# Patient Record
Sex: Female | Born: 1955 | Race: Black or African American | Hispanic: No | Smoking: Current every day smoker
Health system: Southern US, Community
[De-identification: ages and names within clinical notes are randomized; demographics above are authoritative.]

## PROBLEM LIST (undated history)

## (undated) DIAGNOSIS — F1910 Other psychoactive substance abuse, uncomplicated: Secondary | ICD-10-CM

## (undated) DIAGNOSIS — B1920 Unspecified viral hepatitis C without hepatic coma: Secondary | ICD-10-CM

## (undated) DIAGNOSIS — F32A Depression, unspecified: Secondary | ICD-10-CM

## (undated) DIAGNOSIS — I10 Essential (primary) hypertension: Secondary | ICD-10-CM

## (undated) DIAGNOSIS — M1990 Unspecified osteoarthritis, unspecified site: Secondary | ICD-10-CM

## (undated) DIAGNOSIS — F329 Major depressive disorder, single episode, unspecified: Secondary | ICD-10-CM

## (undated) DIAGNOSIS — J449 Chronic obstructive pulmonary disease, unspecified: Secondary | ICD-10-CM

## (undated) HISTORY — DX: Other psychoactive substance abuse, uncomplicated: F19.10

## (undated) HISTORY — DX: Major depressive disorder, single episode, unspecified: F32.9

## (undated) HISTORY — PX: ABDOMINAL HYSTERECTOMY: SHX81

## (undated) HISTORY — DX: Unspecified osteoarthritis, unspecified site: M19.90

## (undated) HISTORY — DX: Depression, unspecified: F32.A

## (undated) HISTORY — DX: Unspecified viral hepatitis C without hepatic coma: B19.20

## (undated) HISTORY — DX: Chronic obstructive pulmonary disease, unspecified: J44.9

## (undated) HISTORY — PX: FRACTURE SURGERY: SHX138

---

## 2000-10-16 ENCOUNTER — Emergency Department (HOSPITAL_COMMUNITY): Admission: EM | Admit: 2000-10-16 | Discharge: 2000-10-16 | Payer: Self-pay | Admitting: *Deleted

## 2000-12-12 ENCOUNTER — Emergency Department (HOSPITAL_COMMUNITY): Admission: EM | Admit: 2000-12-12 | Discharge: 2000-12-12 | Payer: Self-pay | Admitting: Emergency Medicine

## 2000-12-12 ENCOUNTER — Encounter: Payer: Self-pay | Admitting: Emergency Medicine

## 2001-07-07 ENCOUNTER — Emergency Department (HOSPITAL_COMMUNITY): Admission: EM | Admit: 2001-07-07 | Discharge: 2001-07-08 | Payer: Self-pay | Admitting: Emergency Medicine

## 2001-07-15 ENCOUNTER — Emergency Department (HOSPITAL_COMMUNITY): Admission: EM | Admit: 2001-07-15 | Discharge: 2001-07-15 | Payer: Self-pay | Admitting: Emergency Medicine

## 2002-10-12 ENCOUNTER — Emergency Department (HOSPITAL_COMMUNITY): Admission: EM | Admit: 2002-10-12 | Discharge: 2002-10-12 | Payer: Self-pay | Admitting: *Deleted

## 2003-02-08 ENCOUNTER — Encounter: Payer: Self-pay | Admitting: Emergency Medicine

## 2003-02-08 ENCOUNTER — Emergency Department (HOSPITAL_COMMUNITY): Admission: EM | Admit: 2003-02-08 | Discharge: 2003-02-08 | Payer: Self-pay | Admitting: Emergency Medicine

## 2003-08-13 ENCOUNTER — Ambulatory Visit (HOSPITAL_COMMUNITY): Admission: RE | Admit: 2003-08-13 | Discharge: 2003-08-13 | Payer: Self-pay | Admitting: Internal Medicine

## 2004-03-24 ENCOUNTER — Ambulatory Visit (HOSPITAL_COMMUNITY): Admission: RE | Admit: 2004-03-24 | Discharge: 2004-03-24 | Payer: Self-pay | Admitting: Family Medicine

## 2004-03-27 ENCOUNTER — Ambulatory Visit: Payer: Self-pay | Admitting: Family Medicine

## 2004-04-01 ENCOUNTER — Ambulatory Visit: Payer: Self-pay | Admitting: Family Medicine

## 2004-05-08 ENCOUNTER — Ambulatory Visit: Payer: Self-pay | Admitting: Family Medicine

## 2004-07-14 ENCOUNTER — Ambulatory Visit: Payer: Self-pay | Admitting: Family Medicine

## 2004-08-14 ENCOUNTER — Emergency Department (HOSPITAL_COMMUNITY): Admission: EM | Admit: 2004-08-14 | Discharge: 2004-08-14 | Payer: Self-pay | Admitting: Emergency Medicine

## 2004-11-24 ENCOUNTER — Ambulatory Visit: Payer: Self-pay | Admitting: Family Medicine

## 2005-01-18 ENCOUNTER — Ambulatory Visit: Payer: Self-pay | Admitting: Family Medicine

## 2005-01-25 ENCOUNTER — Ambulatory Visit (HOSPITAL_COMMUNITY): Admission: RE | Admit: 2005-01-25 | Discharge: 2005-01-25 | Payer: Self-pay | Admitting: Family Medicine

## 2005-02-24 ENCOUNTER — Ambulatory Visit (HOSPITAL_COMMUNITY): Admission: RE | Admit: 2005-02-24 | Discharge: 2005-02-24 | Payer: Self-pay | Admitting: *Deleted

## 2005-03-23 ENCOUNTER — Ambulatory Visit: Payer: Self-pay | Admitting: Family Medicine

## 2005-04-19 ENCOUNTER — Ambulatory Visit: Payer: Self-pay | Admitting: Family Medicine

## 2005-06-29 ENCOUNTER — Ambulatory Visit: Payer: Self-pay | Admitting: Family Medicine

## 2005-08-10 ENCOUNTER — Ambulatory Visit: Payer: Self-pay | Admitting: Family Medicine

## 2005-08-19 ENCOUNTER — Encounter (HOSPITAL_COMMUNITY): Admission: RE | Admit: 2005-08-19 | Discharge: 2005-09-18 | Payer: Self-pay | Admitting: Family Medicine

## 2005-11-11 ENCOUNTER — Ambulatory Visit: Payer: Self-pay | Admitting: Family Medicine

## 2006-07-12 ENCOUNTER — Ambulatory Visit: Payer: Self-pay | Admitting: Family Medicine

## 2006-07-14 ENCOUNTER — Ambulatory Visit (HOSPITAL_COMMUNITY): Admission: RE | Admit: 2006-07-14 | Discharge: 2006-07-14 | Payer: Self-pay | Admitting: Family Medicine

## 2006-07-14 ENCOUNTER — Encounter: Payer: Self-pay | Admitting: Family Medicine

## 2006-07-14 LAB — CONVERTED CEMR LAB
ALT: 51 units/L — ABNORMAL HIGH (ref 0–35)
AST: 77 units/L — ABNORMAL HIGH (ref 0–37)
Albumin: 4 g/dL (ref 3.5–5.2)
Alkaline Phosphatase: 88 units/L (ref 39–117)
Basophils Relative: 0 % (ref 0–1)
CO2: 23 meq/L (ref 19–32)
Chloride: 104 meq/L (ref 96–112)
Cholesterol: 163 mg/dL (ref 0–200)
Creatinine, Ser: 0.86 mg/dL (ref 0.40–1.20)
Glucose, Bld: 81 mg/dL (ref 70–99)
HCT: 42.9 % (ref 36.0–46.0)
Hemoglobin: 14.2 g/dL (ref 12.0–15.0)
LDL Cholesterol: 65 mg/dL (ref 0–99)
Lymphocytes Relative: 51 % — ABNORMAL HIGH (ref 12–46)
Lymphs Abs: 2.4 10*3/uL (ref 0.7–3.3)
MCHC: 33.1 g/dL (ref 30.0–36.0)
MCV: 97.1 fL (ref 78.0–100.0)
Monocytes Relative: 17 % — ABNORMAL HIGH (ref 3–11)
Neutro Abs: 1.4 10*3/uL — ABNORMAL LOW (ref 1.7–7.7)
Platelets: 262 10*3/uL (ref 150–400)
Potassium: 4.1 meq/L (ref 3.5–5.3)
RBC: 4.42 M/uL (ref 3.87–5.11)
Sodium: 138 meq/L (ref 135–145)
Total Bilirubin: 0.6 mg/dL (ref 0.3–1.2)
Triglycerides: 113 mg/dL (ref <150)
WBC: 4.7 10*3/uL (ref 4.0–10.5)

## 2006-08-22 ENCOUNTER — Ambulatory Visit: Payer: Self-pay | Admitting: Family Medicine

## 2006-08-22 LAB — CONVERTED CEMR LAB: Trichomonal Vaginitis: POSITIVE — AB

## 2006-08-23 ENCOUNTER — Encounter: Payer: Self-pay | Admitting: Family Medicine

## 2006-08-23 LAB — CONVERTED CEMR LAB
Chlamydia, DNA Probe: NEGATIVE
GC Probe Amp, Genital: NEGATIVE

## 2007-03-10 ENCOUNTER — Ambulatory Visit: Payer: Self-pay | Admitting: Family Medicine

## 2007-04-13 ENCOUNTER — Encounter: Payer: Self-pay | Admitting: Family Medicine

## 2007-09-20 ENCOUNTER — Ambulatory Visit: Payer: Self-pay | Admitting: Family Medicine

## 2007-09-26 DIAGNOSIS — S139XXA Sprain of joints and ligaments of unspecified parts of neck, initial encounter: Secondary | ICD-10-CM

## 2007-09-26 DIAGNOSIS — F17200 Nicotine dependence, unspecified, uncomplicated: Secondary | ICD-10-CM

## 2007-09-26 DIAGNOSIS — F329 Major depressive disorder, single episode, unspecified: Secondary | ICD-10-CM

## 2007-09-26 DIAGNOSIS — M542 Cervicalgia: Secondary | ICD-10-CM | POA: Insufficient documentation

## 2007-09-26 DIAGNOSIS — N760 Acute vaginitis: Secondary | ICD-10-CM | POA: Insufficient documentation

## 2007-10-02 ENCOUNTER — Ambulatory Visit: Payer: Self-pay | Admitting: Family Medicine

## 2007-10-12 ENCOUNTER — Other Ambulatory Visit: Admission: RE | Admit: 2007-10-12 | Discharge: 2007-10-12 | Payer: Self-pay | Admitting: Family Medicine

## 2007-10-12 ENCOUNTER — Ambulatory Visit: Payer: Self-pay | Admitting: Family Medicine

## 2007-10-12 ENCOUNTER — Encounter: Payer: Self-pay | Admitting: Family Medicine

## 2007-10-13 ENCOUNTER — Encounter: Payer: Self-pay | Admitting: Family Medicine

## 2007-10-13 LAB — CONVERTED CEMR LAB
ALT: 23 units/L (ref 0–35)
Alkaline Phosphatase: 76 units/L (ref 39–117)
BUN: 12 mg/dL (ref 6–23)
Basophils Absolute: 0 10*3/uL (ref 0.0–0.1)
Basophils Relative: 1 % (ref 0–1)
Bilirubin, Direct: 0.2 mg/dL (ref 0.0–0.3)
Calcium: 9.4 mg/dL (ref 8.4–10.5)
Chloride: 107 meq/L (ref 96–112)
Creatinine, Ser: 0.7 mg/dL (ref 0.40–1.20)
Eosinophils Absolute: 0.2 10*3/uL (ref 0.0–0.7)
Eosinophils Relative: 4 % (ref 0–5)
Hemoglobin: 13.8 g/dL (ref 12.0–15.0)
Lymphocytes Relative: 43 % (ref 12–46)
MCHC: 33.3 g/dL (ref 30.0–36.0)
Potassium: 4.4 meq/L (ref 3.5–5.3)
Sodium: 141 meq/L (ref 135–145)
Total Bilirubin: 0.6 mg/dL (ref 0.3–1.2)
Total Protein: 7.9 g/dL (ref 6.0–8.3)
Trichomonal Vaginitis: POSITIVE — AB
Triglycerides: 51 mg/dL (ref <150)
WBC: 5.6 10*3/uL (ref 4.0–10.5)

## 2008-01-03 ENCOUNTER — Telehealth: Payer: Self-pay | Admitting: Family Medicine

## 2008-02-13 ENCOUNTER — Ambulatory Visit: Payer: Self-pay | Admitting: Family Medicine

## 2008-02-13 DIAGNOSIS — K029 Dental caries, unspecified: Secondary | ICD-10-CM | POA: Insufficient documentation

## 2008-02-18 DIAGNOSIS — F1020 Alcohol dependence, uncomplicated: Secondary | ICD-10-CM | POA: Insufficient documentation

## 2008-02-18 DIAGNOSIS — F1420 Cocaine dependence, uncomplicated: Secondary | ICD-10-CM | POA: Insufficient documentation

## 2008-04-12 ENCOUNTER — Telehealth: Payer: Self-pay | Admitting: Family Medicine

## 2008-04-22 ENCOUNTER — Encounter: Payer: Self-pay | Admitting: Family Medicine

## 2008-05-01 ENCOUNTER — Encounter: Payer: Self-pay | Admitting: Family Medicine

## 2008-05-14 ENCOUNTER — Encounter: Payer: Self-pay | Admitting: Family Medicine

## 2008-05-22 ENCOUNTER — Ambulatory Visit: Payer: Self-pay | Admitting: Family Medicine

## 2008-05-22 DIAGNOSIS — J209 Acute bronchitis, unspecified: Secondary | ICD-10-CM

## 2008-10-23 ENCOUNTER — Ambulatory Visit: Payer: Self-pay | Admitting: Family Medicine

## 2008-10-23 DIAGNOSIS — R079 Chest pain, unspecified: Secondary | ICD-10-CM | POA: Insufficient documentation

## 2008-10-23 DIAGNOSIS — R5381 Other malaise: Secondary | ICD-10-CM

## 2008-10-23 DIAGNOSIS — R5383 Other fatigue: Secondary | ICD-10-CM

## 2008-10-24 ENCOUNTER — Encounter: Payer: Self-pay | Admitting: Family Medicine

## 2008-10-24 LAB — CONVERTED CEMR LAB
BUN: 10 mg/dL (ref 6–23)
Basophils Absolute: 0 10*3/uL (ref 0.0–0.1)
Basophils Relative: 0 % (ref 0–1)
Calcium: 9.2 mg/dL (ref 8.4–10.5)
Chloride: 110 meq/L (ref 96–112)
Eosinophils Relative: 3 % (ref 0–5)
Glucose, Bld: 92 mg/dL (ref 70–99)
HCT: 38.2 % (ref 36.0–46.0)
Hemoglobin: 13 g/dL (ref 12.0–15.0)
MCHC: 34 g/dL (ref 30.0–36.0)
MCV: 96.2 fL (ref 78.0–100.0)
Monocytes Relative: 11 % (ref 3–12)
Neutro Abs: 1.4 10*3/uL — ABNORMAL LOW (ref 1.7–7.7)
Neutrophils Relative %: 28 % — ABNORMAL LOW (ref 43–77)
Total CHOL/HDL Ratio: 2.7
Triglycerides: 299 mg/dL — ABNORMAL HIGH (ref <150)
WBC: 5.1 10*3/uL (ref 4.0–10.5)

## 2008-11-06 ENCOUNTER — Telehealth: Payer: Self-pay | Admitting: Family Medicine

## 2008-11-11 ENCOUNTER — Encounter: Payer: Self-pay | Admitting: Family Medicine

## 2008-11-28 ENCOUNTER — Telehealth: Payer: Self-pay | Admitting: Family Medicine

## 2008-12-03 ENCOUNTER — Encounter: Payer: Self-pay | Admitting: Family Medicine

## 2008-12-11 ENCOUNTER — Encounter: Payer: Self-pay | Admitting: Family Medicine

## 2009-08-21 ENCOUNTER — Ambulatory Visit: Payer: Self-pay | Admitting: Family Medicine

## 2009-08-21 DIAGNOSIS — J019 Acute sinusitis, unspecified: Secondary | ICD-10-CM

## 2010-01-13 ENCOUNTER — Telehealth: Payer: Self-pay | Admitting: Family Medicine

## 2010-01-14 ENCOUNTER — Ambulatory Visit: Payer: Self-pay | Admitting: Family Medicine

## 2010-01-14 DIAGNOSIS — J45909 Unspecified asthma, uncomplicated: Secondary | ICD-10-CM

## 2010-01-14 LAB — CONVERTED CEMR LAB
Basophils Absolute: 0 10*3/uL (ref 0.0–0.1)
Basophils Relative: 0 % (ref 0–1)
CO2: 27 meq/L (ref 19–32)
Calcium: 9.6 mg/dL (ref 8.4–10.5)
Chloride: 105 meq/L (ref 96–112)
Cholesterol: 189 mg/dL (ref 0–200)
Creatinine, Ser: 0.78 mg/dL (ref 0.40–1.20)
Eosinophils Relative: 3 % (ref 0–5)
HDL: 67 mg/dL (ref >39)
Hemoglobin: 14.2 g/dL (ref 12.0–15.0)
MCV: 96.1 fL (ref 78.0–100.0)
Monocytes Absolute: 0.5 10*3/uL (ref 0.1–1.0)
Monocytes Relative: 9 % (ref 3–12)
Neutrophils Relative %: 36 % — ABNORMAL LOW (ref 43–77)
Platelets: 240 10*3/uL (ref 150–400)
Sodium: 140 meq/L (ref 135–145)
TSH: 1.328 microintl units/mL (ref 0.350–4.500)
Total CHOL/HDL Ratio: 2.8
Triglycerides: 69 mg/dL (ref <150)
VLDL: 14 mg/dL (ref 0–40)

## 2010-01-18 DIAGNOSIS — G4700 Insomnia, unspecified: Secondary | ICD-10-CM

## 2010-02-18 ENCOUNTER — Encounter: Payer: Self-pay | Admitting: Family Medicine

## 2010-02-19 ENCOUNTER — Telehealth: Payer: Self-pay | Admitting: Family Medicine

## 2010-02-20 ENCOUNTER — Ambulatory Visit: Payer: Self-pay | Admitting: Family Medicine

## 2010-02-20 DIAGNOSIS — B1710 Acute hepatitis C without hepatic coma: Secondary | ICD-10-CM | POA: Insufficient documentation

## 2010-02-24 ENCOUNTER — Encounter: Payer: Self-pay | Admitting: Family Medicine

## 2010-02-24 ENCOUNTER — Ambulatory Visit (HOSPITAL_COMMUNITY): Admission: RE | Admit: 2010-02-24 | Discharge: 2010-02-24 | Payer: Self-pay | Admitting: Family Medicine

## 2010-03-04 ENCOUNTER — Encounter: Payer: Self-pay | Admitting: Family Medicine

## 2010-03-05 ENCOUNTER — Encounter: Payer: Self-pay | Admitting: Family Medicine

## 2010-03-06 ENCOUNTER — Ambulatory Visit (HOSPITAL_COMMUNITY): Admission: RE | Admit: 2010-03-06 | Payer: Self-pay | Admitting: Family Medicine

## 2010-03-10 ENCOUNTER — Telehealth: Payer: Self-pay | Admitting: Family Medicine

## 2010-03-24 ENCOUNTER — Encounter: Payer: Self-pay | Admitting: Family Medicine

## 2010-03-24 ENCOUNTER — Telehealth (INDEPENDENT_AMBULATORY_CARE_PROVIDER_SITE_OTHER): Payer: Self-pay | Admitting: *Deleted

## 2010-04-16 ENCOUNTER — Ambulatory Visit: Payer: Self-pay | Admitting: Family Medicine

## 2010-04-17 ENCOUNTER — Encounter: Payer: Self-pay | Admitting: Family Medicine

## 2010-05-01 ENCOUNTER — Encounter: Payer: Self-pay | Admitting: Family Medicine

## 2010-05-05 ENCOUNTER — Encounter: Payer: Self-pay | Admitting: Family Medicine

## 2010-05-05 ENCOUNTER — Telehealth: Payer: Self-pay | Admitting: Family Medicine

## 2010-05-12 ENCOUNTER — Telehealth: Payer: Self-pay | Admitting: Family Medicine

## 2010-05-13 ENCOUNTER — Encounter: Payer: Self-pay | Admitting: Family Medicine

## 2010-05-14 ENCOUNTER — Encounter: Payer: Self-pay | Admitting: Family Medicine

## 2010-05-21 LAB — CONVERTED CEMR LAB
Alkaline Phosphatase: 89 units/L (ref 39–117)
Bilirubin, Direct: 0.1 mg/dL (ref 0.0–0.3)

## 2010-05-23 ENCOUNTER — Encounter: Payer: Self-pay | Admitting: Family Medicine

## 2010-05-24 ENCOUNTER — Encounter: Payer: Self-pay | Admitting: Family Medicine

## 2010-05-25 ENCOUNTER — Encounter: Payer: Self-pay | Admitting: Family Medicine

## 2010-05-27 ENCOUNTER — Encounter: Payer: Self-pay | Admitting: Family Medicine

## 2010-06-02 NOTE — Progress Notes (Signed)
Summary: went to dentist  Phone Note Call from Patient   Summary of Call: went to dentist and the dentisit said she has hep c  and to call you and let her know   dr. Lynann Beaver in [place] call her at [phone] only speak to her Initial call taken by: [name],  February 19, 2010 2:42 PM  Follow-up for Phone Call        pls contact the dentist's officre for the positive results , fax a stamped request from this office, she will be referred to Dr Renae Fickle , the [facility] doc downstairs once i have the results for further evaluation and treatment, let her kn ow  Follow-up by: Syliva Overman MD,  February 19, 2010 7:57 PM  Additional Follow-up for Phone Call Additional follow up Details #1::        Dr. Randa Evens office is faxing over most recent labs. Additional Follow-up by: Mauricia Area CMA,  February 23, 2010 2:23 PM    Additional Follow-up for Phone Call Additional follow up Details #2::    info received Follow-up by: Syliva Overman MD,  February 24, 2010 5:55 PM

## 2010-06-02 NOTE — Assessment & Plan Note (Signed)
Summary: ov   Vital Signs:  Patient profile:   55 year old female Menstrual status:  postmenopausal Height:      64 inches Weight:      147.50 pounds BMI:     25.41 O2 Sat:      98 % Pulse rate:   72 / minute Pulse rhythm:   regular Resp:     16 per minute  Vitals Entered By: Everitt Amber LPN (January 14, 2010 10:40 AM)  Nutrition Counseling: Patient's BMI is greater than 25 and therefore counseled on weight management options. CC: Follow up chronic problems, supposed to have teeth pulled monday and she has a bad toothache and wants pain meds to last until then. Also having some chest congestion     Menstrual Status postmenopausal Last PAP Result normal   Primary Care Leanah Kolander:  Syliva Overman MD  CC:  Follow up chronic problems and supposed to have teeth pulled monday and she has a bad toothache and wants pain meds to last until then. Also having some chest congestion.  History of Present Illness: Reports  that sahe has been doing fairly well. She is bothered by a bad toothache as well as chest congestion in the past 2 weeks she denies less alcohol use.  Denies recent fever or chills. Denies sinus pressure, nasal congestion , ear pain or sore throat.  Denies chest pain, palpitations, PND, orthopnea or leg swelling. Denies abdominal pain, nausea, vomitting, diarrhea or constipation. Denies change in bowel movements or bloody stool. Denies dysuria , frequency, incontinence or hesitancy. Denies  joint pain, swelling, or reduced mobility. Denies headaches, vertigo, seizures. Denies depression ort anxietyshe does have  insomnia. Denies  rash, lesions, or itch.     Current Medications (verified): 1)  Flexeril 10 Mg Tabs (Cyclobenzaprine Hcl) .... Take 1 Tab By Mouth At Bedtime As Needed 2)  Tramadol Hcl 50 Mg Tabs (Tramadol Hcl) .... Take 1 Tablet By Mouth Once A Day As Needed 3)  Proventil Hfa 108 (90 Base) Mcg/act Aers (Albuterol Sulfate) .... 2 Puffs Every 6 To 8  Hrs As Needed 4)  Ra Omeprazole 20 Mg Tbec (Omeprazole) .... Take 1 Capsule By Mouth Once A Day 5)  Fluoxetine Hcl 10 Mg Caps (Fluoxetine Hcl) .... Take 1 Capsule By Mouth Once A Day 6)  Naprosyn 500 Mg Tabs (Naproxen) .... Take 1 Two Times A Day   Discontinue Ibuprofen  Allergies (verified): 1)  ! Pcn  Review of Systems      See HPI General:  Complains of fatigue and sleep disorder; reports difficulty falling and staying asleep, wants help. Eyes:  Denies discharge, eye pain, and red eye. Heme:  Denies abnormal bruising and bleeding. Allergy:  Denies hives or rash and itching eyes.  Physical Exam  General:  Well-developed,adequately nourished,in no acute distress; alert,appropriate and cooperative throughout examination HEENT: No facial asymmetry,  EOMI, No sinus tenderness, TM's Clear, oropharynx  pink and moist. Poor dentition with multiple cavities  Chest: decreased air entry scattewred crackles, no wheezes CVS: S1, S2, No murmurs, No S3.   Abd: Soft, Nontender.  MS: Adequate ROM spine, hips, shoulders and knees.  Ext: No edema.   CNS: CN 2-12 intact, power tone and sensation normal throughout.   Skin: Intact, no visible lesions or rashes.  Psych: Good eye contact, normal affect.  Memory intact, not anxious or depressed appearing.    Impression & Recommendations:  Problem # 1:  INTRINSIC ASTHMA, UNSPECIFIED (ICD-493.10) Assessment Unchanged  Her updated medication  list for this problem includes:    Proventil Hfa 108 (90 Base) Mcg/act Aers (Albuterol sulfate) .Marland Kitchen... 2 puffs every 6 to 8 hrs as needed  Problem # 2:  DENTAL CARIES (ICD-521.00) Assessment: Deteriorated  Problem # 3:  DEPRESSION (ICD-311) Assessment: Improved  Her updated medication list for this problem includes:    Fluoxetine Hcl 10 Mg Caps (Fluoxetine hcl) .Marland Kitchen... Take 1 capsule by mouth once a day  Problem # 4:  INSOMNIA (ICD-780.52) Assessment: Deteriorated  Her updated medication list for this  problem includes:    Temazepam 15 Mg Caps (Temazepam) .Marland Kitchen... Take 1 capsule by mouth at bedtime  Discussed sleep hygiene.   Complete Medication List: 1)  Flexeril 10 Mg Tabs (Cyclobenzaprine hcl) .... Take 1 tab by mouth at bedtime as needed 2)  Tramadol Hcl 50 Mg Tabs (Tramadol hcl) .... Take 1 tablet by mouth once a day as needed 3)  Proventil Hfa 108 (90 Base) Mcg/act Aers (Albuterol sulfate) .... 2 puffs every 6 to 8 hrs as needed 4)  Ra Omeprazole 20 Mg Tbec (Omeprazole) .... Take 1 capsule by mouth once a day 5)  Fluoxetine Hcl 10 Mg Caps (Fluoxetine hcl) .... Take 1 capsule by mouth once a day 6)  Naprosyn 500 Mg Tabs (Naproxen) .... Take 1 two times a day   discontinue ibuprofen 7)  Temazepam 15 Mg Caps (Temazepam) .... Take 1 capsule by mouth at bedtime 8)  Erythromycin Base 250 Mg Tabs (Erythromycin base) .... Take 1 tablet by mouth three times a day  Other Orders: T-Basic Metabolic Panel [phone]) T-Lipid Profile [phone]) T-CBC w/Diff [phone]) T-TSH [phone]) Radiology Referral (Radiology)  Patient Instructions: 1)  CPE in 2 to 3 months 2)  BMP prior to visit, ICD-9: 3)  Lipid Panel prior to visit, ICD-9:  fastting today 4)  TSH prior to visit, ICD-9: 5)  CBC w/ Diff prior to visit, ICD-9: 6)  We will refer you for a mamogram 7)  New med for sleep and med for dental caries  Prescriptions: NAPROSYN 500 MG TABS (NAPROXEN) take 1 two times a day   Discontinue Ibuprofen  #60 x 2   Entered by:   Everitt Amber LPN   Authorized by:   Syliva Overman MD   Signed by:   Everitt Amber LPN on 24/40/1027   Method used:   Electronically to        [facility] [address] [phone]* (retail)       [address] [place], [place]  [address]       Ph: [phone]       Fax: [phone]   RxID:   [id number] TRAMADOL HCL 50 MG TABS (TRAMADOL HCL) Take 1 tablet by mouth once a day as needed  #30 x 2   Entered by:   Everitt Amber LPN   Authorized by:   Syliva Overman MD   Signed by:   Everitt Amber LPN on 23/55/7322   Method used:   Electronically to        [facility] [address] [phone]* (retail)       [address] [place], [place]  [address]       Ph: [phone]       Fax: [phone]   RxID:   [id number] FLUOXETINE HCL 10 MG CAPS (FLUOXETINE HCL) Take 1 capsule by mouth once a day  #30 x 2   Entered by:   Merry Proud  Hudy LPN   Authorized by:   Syliva Overman MD   Signed by:   Everitt Amber LPN on 10/27/9483   Method used:   Electronically to        [facility] [phone]* (retail)       [address] [place], [place]  [address]       Ph: [phone]       Fax: [phone]   RxID:   [id number] RA OMEPRAZOLE 20 MG TBEC (OMEPRAZOLE) Take 1 capsule by mouth once a day  #30 x 2   Entered by:   Everitt Amber LPN   Authorized by:   Syliva Overman MD   Signed by:   Everitt Amber LPN on 35/36/1443   Method used:   Electronically to        [facility] [address] [phone]* (retail)       [address] [place], [place]  [address]       Ph: [phone]       Fax: [phone]   RxID:   [id number] PROVENTIL HFA 108 (90 BASE) MCG/ACT AERS (ALBUTEROL SULFATE) 2 puffs every 6 to 8 hrs as needed  #1 x 2   Entered by:   Everitt Amber LPN   Authorized by:   Syliva Overman MD   Signed by:   Everitt Amber LPN on 02/40/9735   Method used:   Electronically to        [facility] [address] [phone]* (retail)       [address] [place], [place]  [address]       Ph: [phone]       Fax: [phone]   RxID:   [id number] FLEXERIL 10 MG TABS (CYCLOBENZAPRINE HCL) Take 1 tab by mouth at bedtime as needed  #30 x 2   Entered by:   Everitt Amber LPN   Authorized by:   Syliva Overman MD   Signed by:   Everitt Amber LPN on 26/37/8588   Method used:   Electronically to        [facility] [phone]* (retail)       [address] [place], [place]  [address]       Ph: [phone]       Fax: [phone]   RxID:    [id number] ERYTHROMYCIN BASE 250 MG TABS (ERYTHROMYCIN BASE) Take 1 tablet by mouth three times a day  #30 x 90   Entered and Authorized by:   Syliva Overman MD   Signed by:   Syliva Overman MD on 01/14/2010   Method used:   Electronically to        [facility] [address] [phone]* (retail)       [address] [place], [place]  [address]       Ph: [phone]       Fax: [phone]   RxID:   [id number] TEMAZEPAM 15 MG CAPS (TEMAZEPAM) Take 1 capsule by mouth at bedtime  #30 x 2   Entered and Authorized by:   Syliva Overman MD   Signed by:   Syliva Overman MD on 01/14/2010   Method used:   Printed then faxed to ...       [facility] [address] [phone]* (retail)       [address]  [place], [place]  [address]       Ph: [phone]       Fax: [phone]   RxID:   [id number]

## 2010-06-02 NOTE — Letter (Signed)
Summary: DR. Karilyn Cota PAT NOT RETURNING CALL  DR. Karilyn Cota PAT NOT RETURNING CALL   Imported By: [name] 03/24/2010 17:28:59  _____________________________________________________________________  External Attachment:    Type:   Image     Comment:   External Document

## 2010-06-02 NOTE — Progress Notes (Signed)
Summary: lab order  Phone Note Call from Patient   Summary of Call: needs for you to send over her lab order you will be there next week like monday at 10:00 am Initial call taken by: [name],  March 10, 2010 8:34 AM  Follow-up for Phone Call        sent as requested Follow-up by: [name] LPN,  March 10, 2010 9:54 AM

## 2010-06-02 NOTE — Assessment & Plan Note (Signed)
Summary: follow up, needs refills- room 1   Vital Signs:  Patient profile:   55 year old female Height:      64 inches Weight:      142.75 pounds BMI:     24.59 O2 Sat:      100 % on Room air Pulse rate:   84 / minute Resp:     16 per minute BP sitting:   130 / 70  (left arm)  Vitals Entered By: [name] LPN (August 21, 2009 2:16 PM) CC: follow-up visit Is Patient Diabetic? No Pain Assessment Patient in pain? no      Comments patient states she hasnt taken any meds in 2 months, didnt bring any meds   Primary Provider:  Syliva Overman MD  CC:  follow-up visit.  History of Present Illness: Pt is here today stating she needs something for her HA's, a nerve pill, inhaler & something for pain.  She reports frontal HA x 3 weeks. + nasal congestion.  Little sneezing.  Green nasal mucus.  Little cough & sometimes has wheeze. Smoker - 1pack/wk Has quit ETOH 4 mos. Denies drugs.  "I need something to help me sleep.  I wake up every 15-20 minutes."  States she has received prescription for this before & worked well.  Also gets muscle spasms in her Lt arm, neck & back.  Pt stats she is having problems with anxiety.  Has been out of meds for a few mos now. She has moved away from her previous friends who she feels were a bad influence on her.  This has helped her to quit drinking.   Allergies (verified): 1)  ! Pcn  Past History:  Past medical history reviewed for relevance to current acute and chronic problems.  Past Medical History: Reviewed history from 04/13/2007 and no changes required. NECK SPASM (ICD-847.0) VAGINITIS (ICD-616.10) NECK PAIN, CHRONIC (ICD-723.1) DEPRESSION (ICD-311) TOBACCO USER (ICD-305.1)  Review of Systems General:  Denies chills and fever. ENT:  Complains of nasal congestion and postnasal drainage; denies earache and sore throat. CV:  Denies chest pain or discomfort and palpitations. Resp:  Complains of cough and wheezing; denies  shortness of breath and sputum productive.  Physical Exam  General:  Well-developed,well-nourished,in no acute distress; alert,appropriate and cooperative throughout examination Head:  Normocephalic and atraumatic without obvious abnormalities. No apparent alopecia or balding. Ears:  External ear exam shows no significant lesions or deformities.  Otoscopic examination reveals clear canals, tympanic membranes are intact bilaterally without bulging, retraction, inflammation or discharge. Hearing is grossly normal bilaterally. Nose:  no external deformity, nasal discharge, mucosal erythema, and mucosal edema.   Mouth:  Oral mucosa and oropharynx without lesions or exudates.  poor dentition and teeth missing.   Neck:  No deformities, masses, or tenderness noted. Lungs:  Normal respiratory effort, chest expands symmetrically. Lungs are clear to auscultation, no crackles or wheezes. Heart:  Normal rate and regular rhythm. S1 and S2 normal without gallop, murmur, click, rub or other extra sounds. Msk:  FROM &nontender to palp c-spine. Pulses:  R radial normal and L radial normal.   Extremities:  No clubbing, cyanosis, edema, or deformity noted with normal full range of motion of all joints.   Neurologic:  alert & oriented X3, sensation intact to light touch, and DTRs symmetrical and normal.   Skin:  Intact without suspicious lesions or rashes Cervical Nodes:  No lymphadenopathy noted Psych:  Cognition and judgment appear intact. Alert and cooperative with normal attention span  and concentration. No apparent delusions, illusions, hallucinations   Impression & Recommendations:  Problem # 1:  SINUSITIS, ACUTE (ICD-461.9) Assessment New  Her updated medication list for this problem includes:    Bactrim Ds 800-160 Mg Tabs (Sulfamethoxazole-trimethoprim) .Marland Kitchen... Take 1 two times a day for 10 days  Problem # 2:  NECK SPASM (ICD-847.0) Assessment: Unchanged  The following medications were removed from  the medication list:    Ibuprofen 800 Mg Tabs (Ibuprofen) .Marland Kitchen... Take 1 tablet by mouth three times a day Her updated medication list for this problem includes:    Flexeril 10 Mg Tabs (Cyclobenzaprine hcl) .Marland Kitchen... Take 1 tab by mouth at bedtime as needed    Tramadol Hcl 50 Mg Tabs (Tramadol hcl) .Marland Kitchen... Take 1 tablet by mouth once a day as needed    Naprosyn 500 Mg Tabs (Naproxen) .Marland Kitchen... Take 1 two times a day   discontinue ibuprofen  Problem # 3:  DEPRESSION (ICD-311) Assessment: Deteriorated Out of meds.  Her updated medication list for this problem includes:    Fluoxetine Hcl 10 Mg Caps (Fluoxetine hcl) .Marland Kitchen... Take 1 capsule by mouth once a day  Problem # 4:  TOBACCO USER (ICD-305.1) Assessment: Unchanged  Encouraged smoking cessation   Complete Medication List: 1)  Flexeril 10 Mg Tabs (Cyclobenzaprine hcl) .... Take 1 tab by mouth at bedtime as needed 2)  Tramadol Hcl 50 Mg Tabs (Tramadol hcl) .... Take 1 tablet by mouth once a day as needed 3)  Proventil Hfa 108 (90 Base) Mcg/act Aers (Albuterol sulfate) .... 2 puffs every 6 to 8 hrs as needed 4)  Ra Omeprazole 20 Mg Tbec (Omeprazole) .... Take 1 capsule by mouth once a day 5)  Fluoxetine Hcl 10 Mg Caps (Fluoxetine hcl) .... Take 1 capsule by mouth once a day 6)  Naprosyn 500 Mg Tabs (Naproxen) .... Take 1 two times a day   discontinue ibuprofen 7)  Bactrim Ds 800-160 Mg Tabs (Sulfamethoxazole-trimethoprim) .... Take 1 two times a day for 10 days  Patient Instructions: 1)  Please schedule a follow-up appointment in 2 months wit Dr Lodema Hong. 2)  I have refilled your medications for you. 3)  I have prescribed Naproxen in place of Ibuprofen. 4)  I have prescribed an antibiotic for you. 5)  Tobacco is very bad for your health and your loved ones! You Should stop smoking!. 6)  Stop Smoking Tips: Choose a Quit date. Cut down before the Quit date. decide what you will do as a substitute when you feel the urge to  smoke(gum,toothpick,exercise). Prescriptions: BACTRIM DS 800-160 MG TABS (SULFAMETHOXAZOLE-TRIMETHOPRIM) take 1 two times a day for 10 days  #20 x 0   Entered and Authorized by:   Esperanza Sheets PA   Signed by:   Esperanza Sheets PA on 08/21/2009   Method used:   Electronically to        [facility]* (retail)       [address]       [place], [place]  [address]       Ph: [phone]       Fax: [phone]   RxID:   [id number] NAPROSYN 500 MG TABS (NAPROXEN) take 1 two times a day   Discontinue Ibuprofen  #60 x 1   Entered and Authorized by:   Esperanza Sheets PA   Signed by:   Esperanza Sheets PA on 08/21/2009   Method used:   Electronically to  [facility]* (retail)       [address]       [place], [place]  [address]       Ph: [phone]       Fax: [phone]   RxID:   [id number] RA OMEPRAZOLE 20 MG TBEC (OMEPRAZOLE) Take 1 capsule by mouth once a day  #30 x 3   Entered and Authorized by:   Esperanza Sheets PA   Signed by:   Esperanza Sheets PA on 08/21/2009   Method used:   Electronically to        [facility]* (retail)       [address]       [place], [place]  [address]       Ph: [phone]       Fax: [phone]   RxID:   [id number] FLUOXETINE HCL 10 MG CAPS (FLUOXETINE HCL) Take 1 capsule by mouth once a day  #30 x 3   Entered and Authorized by:   Esperanza Sheets PA   Signed by:   Esperanza Sheets PA on 08/21/2009   Method used:   Electronically to        [facility]* (retail)       [address]       [place], [place]  [address]       Ph: [phone]       Fax: [phone]   RxID:   [id number] PROVENTIL HFA 108 (90 BASE) MCG/ACT AERS (ALBUTEROL SULFATE) 2 puffs every 6 to 8 hrs as needed  #1 x 3   Entered and Authorized by:   Esperanza Sheets PA   Signed by:   Esperanza Sheets PA on 08/21/2009   Method used:   Electronically to         [facility]* (retail)       [address]       [place], [place]  [address]       Ph: [phone]       Fax: [phone]   RxID:   [id number] TRAMADOL HCL 50 MG TABS (TRAMADOL HCL) Take 1 tablet by mouth once a day as needed  #30 x 1   Entered and Authorized by:   Esperanza Sheets PA   Signed by:   Esperanza Sheets PA on 08/21/2009   Method used:   Electronically to        [facility]* (retail)       [address]       [place], [place]  [address]       Ph: [phone]       Fax: [phone]   RxID:   [id number] FLEXERIL 10 MG TABS (CYCLOBENZAPRINE HCL) Take 1 tab by mouth at bedtime as needed  #30 x 1   Entered and Authorized by:   Esperanza Sheets PA   Signed by:   Esperanza Sheets PA on 08/21/2009   Method used:   Electronically to        [facility]* (retail)       [address] [place], [place]  [address]       Ph: [phone]  Fax: [phone]   RxID:   [id number]

## 2010-06-02 NOTE — Miscellaneous (Signed)
  Clinical Lists Changes 

## 2010-06-02 NOTE — Progress Notes (Signed)
Summary: medicines  Phone Note Call from Patient   Summary of Call: needs all medicine to [facility]  call back at   Initial call taken by: [name],  January 13, 2010 1:02 PM  Follow-up for Phone Call        refills will be sent at time of ov tomorrow Follow-up by: [name] LPN,  January 13, 2010 1:58 PM

## 2010-06-02 NOTE — Miscellaneous (Signed)
Summary: Orders Update  Clinical Lists Changes  Orders: Added new Test order of T-Hepatic Function ([id number]) - Signed 

## 2010-06-02 NOTE — Letter (Signed)
Summary: dentisit  dentisit   Imported By: [name] 04/02/2010 08:22:21  _____________________________________________________________________  External Attachment:    Type:   Image     Comment:   External Document

## 2010-06-02 NOTE — Letter (Signed)
Summary: [facility]ME PAPER  CCME PAPER   Imported By: [name] 03/05/2010 09:23:37  _____________________________________________________________________  External Attachment:    Type:   Image     Comment:   External Document

## 2010-06-02 NOTE — Miscellaneous (Signed)
  Clinical Lists Changes  Orders: Added new Referral order of Gastroenterology Referral (GI) - Signed  
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
## 2010-06-04 NOTE — Letter (Signed)
Summary: 1st missed letter  1st missed letter   Imported By: [name] 04/17/2010 14:07:41  _____________________________________________________________________  External Attachment:    Type:   Image     Comment:   External Document

## 2010-06-04 NOTE — Letter (Signed)
Summary: CERTIFIED MAIL RECEIPT  CERTIFIED MAIL RECEIPT   Imported By: [name] 05/14/2010 16:50:59  _____________________________________________________________________  External Attachment:    Type:   Image     Comment:   External Document

## 2010-06-04 NOTE — Letter (Signed)
Summary: sent out certified  sent out certified   Imported By: Curtis Sites 05/14/2010 09:27:26  _____________________________________________________________________  External Attachment:    Type:   Image     Comment:   External Document

## 2010-06-04 NOTE — Progress Notes (Signed)
Summary: ccme paper  Phone Note Call from Patient   Summary of Call: Yvonne Clay called and she was with [name] and they want you to fill out the ccme paper and to  put  [facility] on the paper and fax back  you can call her at [phone] if needed Initial call taken by: [name],  May 05, 2010 11:56 AM  Follow-up for Phone Call        form completed Follow-up by: [name] LPN,  May 07, 2010 10:10 AM

## 2010-06-04 NOTE — Letter (Signed)
Summary: requesting patient to call  requesting patient to call   Imported By: Curtis Sites 05/05/2010 15:05:04  _____________________________________________________________________  External Attachment:    Type:   Image     Comment:   External Document

## 2010-06-04 NOTE — Letter (Signed)
Summary: returned mail  returned mail   Imported By: [name] 05/27/2010 14:03:31  _____________________________________________________________________  External Attachment:    Type:   Image     Comment:   External Document

## 2010-06-04 NOTE — Letter (Signed)
Summary: Letter  Letter   Imported By: [name] 05/13/2010 13:03:38  _____________________________________________________________________  External Attachment:    Type:   Image     Comment:   External Document

## 2010-06-04 NOTE — Progress Notes (Signed)
  Phone Note Other Incoming   Caller: dr simpson Summary of Call: this pt does not qualify on medical grounds for personal care services, she is able to care for herself, pls let her know Initial call taken by: Syliva Overman MD,  May 12, 2010 8:00 AM  Follow-up for Phone Call        called patient, no answer Follow-up by: [name] LPN,  May 12, 2010 2:33 PM  Additional Follow-up for Phone Call Additional follow up Details #1::        called patient, left message, mailed letter Additional Follow-up by: [name] LPN,  May 13, 2010 9:40 AM

## 2010-06-04 NOTE — Progress Notes (Signed)
Summary: Gastro referral  Phone Note Outgoing Call   Summary of Call: I called Mrs Rumsey to inform her that we were trying to schedule her an appt. with dr Patty Sermons office.  I also called to follow up to see if she had lab work done.  The home # on file a lady answered and said No Dandra Langland lived there, called b/f cell and asked him to have her call us, he called back and gave me another # [phone].  I have left a msg for her to return my call.  I faxed over paper work to Dr Patty Sermons office an dthey said they have tried calling patient 3 times to advise patient they needed lab work from Education officer, community.  and she hasn't called them back or brought labs in.  Until they have the results they can't schedule her an appt.  Luann had already sent a request to them, which I wasn't aware of, so when I sent the paperwork down that is when they responded and told me all of the above. Initial call taken by: Curtis Sites,  March 24, 2010 9:52 AM  Follow-up for Phone Call        thanks, noted, if you can ge her on the tele, psl asdk herto come by so we can explain the imptance of keeping appt with GI Follow-up by: Syliva Overman MD,  March 24, 2010 12:13 PM  Additional Follow-up for Phone Call Additional follow up Details #1::        Tried calling patient no answer. Curtis Sites  March 31, 2010 4:52 PM     Additional Follow-up for Phone Call Additional follow up Details #2::    I mailed her out a letter on Friday to see if she would please contact us so we can make appt. Follow-up by: Curtis Sites,  May 05, 2010 3:31 PM

## 2010-06-04 NOTE — Letter (Signed)
Summary: returned letter from mail  returned letter from mail   Imported By: [name] 05/05/2010 16:34:58  _____________________________________________________________________  External Attachment:    Type:   Image     Comment:   External Document

## 2010-07-16 ENCOUNTER — Encounter: Payer: Self-pay | Admitting: Family Medicine

## 2010-08-11 ENCOUNTER — Encounter: Payer: Self-pay | Admitting: Family Medicine

## 2010-08-12 ENCOUNTER — Encounter: Payer: Self-pay | Admitting: Family Medicine

## 2010-08-13 ENCOUNTER — Encounter: Payer: Self-pay | Admitting: Family Medicine

## 2010-08-13 ENCOUNTER — Ambulatory Visit (INDEPENDENT_AMBULATORY_CARE_PROVIDER_SITE_OTHER): Payer: Medicaid Other | Admitting: Family Medicine

## 2010-08-13 VITALS — BP 112/70 | HR 107 | Resp 16 | Ht 65.25 in | Wt 140.1 lb

## 2010-08-13 DIAGNOSIS — M25571 Pain in right ankle and joints of right foot: Secondary | ICD-10-CM

## 2010-08-13 DIAGNOSIS — M62838 Other muscle spasm: Secondary | ICD-10-CM

## 2010-08-13 DIAGNOSIS — J45909 Unspecified asthma, uncomplicated: Secondary | ICD-10-CM

## 2010-08-13 DIAGNOSIS — F17200 Nicotine dependence, unspecified, uncomplicated: Secondary | ICD-10-CM

## 2010-08-13 DIAGNOSIS — M25579 Pain in unspecified ankle and joints of unspecified foot: Secondary | ICD-10-CM

## 2010-08-13 DIAGNOSIS — B1710 Acute hepatitis C without hepatic coma: Secondary | ICD-10-CM

## 2010-08-13 DIAGNOSIS — J449 Chronic obstructive pulmonary disease, unspecified: Secondary | ICD-10-CM

## 2010-08-13 MED ORDER — CYCLOBENZAPRINE HCL 10 MG PO TABS: 10.0000 mg | ORAL_TABLET | Freq: Every evening | ORAL | Status: DC | PRN

## 2010-08-13 MED ORDER — CYCLOBENZAPRINE HCL 10 MG PO TABS: 10.0000 mg | ORAL_TABLET | Freq: Three times a day (TID) | ORAL | Status: DC | PRN

## 2010-08-13 NOTE — Patient Instructions (Signed)
Physical and pap in 4 months.  You are being referred to orthopedics about your ankle   New med sent in for muscle spasm which you have in your neck

## 2010-08-13 NOTE — Progress Notes (Signed)
  Subjective:    Patient ID: Yvonne Clay, female    DOB: 07-12-1955, 55 y.o.   MRN: [id number]  HPI  C/o increased right ankle pain with instability , she hurt it approx 10 yrs ago. Never saw ortho , states her ins was declined by Dr. Hilda Lias. Reports when she tries to walk ,the ankle twists and it as though she will fall.  States she is unable to  obtain teeth, and that food is sticking when she tries to swallow it. No recent asthma flares, and reports less nicotine and alcohol use.  Review of Systems Denies recent fever or chills. Denies sinus pressure, nasal congestion, ear pain or sore throat. Denies chest congestion, productive cough or wheezing. Denies chest pains, palpitations, paroxysmal nocturnal dyspnea, orthopnea and leg swelling Denies abdominal pain, nausea, vomiting,diarrhea or constipation.  Denies rectal bleeding or change in bowel movement. . Denies depression, anxiety or insomnia. Denies skin break down or rash.        Objective:   Physical Exam Patient alert and oriented and in no Cardiopulmonary distress.  HEENT: No facial asymmetry, EOMI, , Oropharynx pink and moist.  Neck supple no adenopathy.  Chest: Clear to auscultation bilaterally.decreased air entry bilaterally  CVS: S1, S2 no murmurs, no S3.  ABD: Soft non tender. Bowel sounds normal.  Ext: No edema  MS: Adequate ROM spine, shoulders, hips and knees.Decreased in right ankle , which is swollen and tender  Skin: Intact, no ulcerations or rash noted.    CNS: CN 2-12 intact, power, tone and sensation normal throughout.        Assessment & Plan:

## 2010-08-19 ENCOUNTER — Encounter: Payer: Self-pay | Admitting: Family Medicine

## 2010-08-24 ENCOUNTER — Telehealth: Payer: Self-pay | Admitting: Family Medicine

## 2010-08-26 NOTE — Telephone Encounter (Signed)
pls let them know in my opinion pt is able to bathe and feed herself and is fully ambulatory, does not need personal care

## 2010-08-28 NOTE — Telephone Encounter (Signed)
Advised patient does not qualify

## 2010-09-07 ENCOUNTER — Encounter: Payer: Self-pay | Admitting: Family Medicine

## 2010-09-07 DIAGNOSIS — J449 Chronic obstructive pulmonary disease, unspecified: Secondary | ICD-10-CM | POA: Insufficient documentation

## 2010-09-07 DIAGNOSIS — M25571 Pain in right ankle and joints of right foot: Secondary | ICD-10-CM | POA: Insufficient documentation

## 2010-09-07 NOTE — Assessment & Plan Note (Signed)
Improved due to reduce nicotine use

## 2010-09-07 NOTE — Assessment & Plan Note (Signed)
Pt still has not been to GI for evaluation despite referral and attempts by the clinic to contact her, dx made in 2011 when she had dental extraction

## 2010-09-07 NOTE — Assessment & Plan Note (Addendum)
Reports decreased usage, therefore improved

## 2010-09-07 NOTE — Assessment & Plan Note (Signed)
stable °

## 2010-09-07 NOTE — Assessment & Plan Note (Signed)
Deteriorated, refer to ortho

## 2010-12-03 ENCOUNTER — Encounter: Payer: Self-pay | Admitting: Family Medicine

## 2010-12-04 ENCOUNTER — Encounter: Payer: Self-pay | Admitting: Family Medicine

## 2010-12-07 ENCOUNTER — Encounter: Payer: Self-pay | Admitting: Family Medicine

## 2010-12-07 ENCOUNTER — Ambulatory Visit (INDEPENDENT_AMBULATORY_CARE_PROVIDER_SITE_OTHER): Payer: Medicaid Other | Admitting: Family Medicine

## 2010-12-07 VITALS — BP 120/70 | HR 82 | Resp 16 | Ht 64.5 in | Wt 130.4 lb

## 2010-12-07 DIAGNOSIS — J45909 Unspecified asthma, uncomplicated: Secondary | ICD-10-CM

## 2010-12-07 DIAGNOSIS — G4700 Insomnia, unspecified: Secondary | ICD-10-CM

## 2010-12-07 DIAGNOSIS — S139XXA Sprain of joints and ligaments of unspecified parts of neck, initial encounter: Secondary | ICD-10-CM

## 2010-12-07 DIAGNOSIS — Z23 Encounter for immunization: Secondary | ICD-10-CM

## 2010-12-07 DIAGNOSIS — F329 Major depressive disorder, single episode, unspecified: Secondary | ICD-10-CM

## 2010-12-07 DIAGNOSIS — Z9181 History of falling: Secondary | ICD-10-CM

## 2010-12-07 DIAGNOSIS — B1920 Unspecified viral hepatitis C without hepatic coma: Secondary | ICD-10-CM

## 2010-12-07 DIAGNOSIS — Z1211 Encounter for screening for malignant neoplasm of colon: Secondary | ICD-10-CM

## 2010-12-07 DIAGNOSIS — R319 Hematuria, unspecified: Secondary | ICD-10-CM

## 2010-12-07 DIAGNOSIS — F3289 Other specified depressive episodes: Secondary | ICD-10-CM

## 2010-12-07 DIAGNOSIS — R109 Unspecified abdominal pain: Secondary | ICD-10-CM

## 2010-12-07 DIAGNOSIS — R5381 Other malaise: Secondary | ICD-10-CM

## 2010-12-07 DIAGNOSIS — B1710 Acute hepatitis C without hepatic coma: Secondary | ICD-10-CM

## 2010-12-07 DIAGNOSIS — J40 Bronchitis, not specified as acute or chronic: Secondary | ICD-10-CM

## 2010-12-07 DIAGNOSIS — Z79899 Other long term (current) drug therapy: Secondary | ICD-10-CM

## 2010-12-07 LAB — POCT URINALYSIS DIPSTICK
Glucose, UA: NEGATIVE
Nitrite, UA: NEGATIVE
Urobilinogen, UA: 2

## 2010-12-07 MED ORDER — SULFAMETHOXAZOLE-TRIMETHOPRIM 800-160 MG PO TABS: 1.0000 | ORAL_TABLET | Freq: Two times a day (BID) | ORAL | Status: AC

## 2010-12-07 MED ORDER — BENZONATATE 100 MG PO CAPS: 100.0000 mg | ORAL_CAPSULE | Freq: Four times a day (QID) | ORAL | Status: DC | PRN

## 2010-12-07 NOTE — Assessment & Plan Note (Signed)
Antibiotics prescribed 

## 2010-12-07 NOTE — Assessment & Plan Note (Signed)
Sleep hygiene discussed, controlled on medication

## 2010-12-07 NOTE — Assessment & Plan Note (Signed)
Unchanged no med change, stable at this time

## 2010-12-07 NOTE — Assessment & Plan Note (Signed)
New lower abdominal pain, and needs colonoscopy, advised pt of need to see GI for colonoscopy as well as for hep C, several unsuccesful attempts were made from the GI office in the past to contact her, she has provided a # for her sister

## 2010-12-07 NOTE — Patient Instructions (Addendum)
CPE in November   Medication is sent in for your cough  Fasting labs before next visit.  LABWORK  NEEDS TO BE DONE BETWEEN 3 TO 7 DAYS BEFORE YOUR NEXT SCEDULED  VISIT.  THIS WILL IMPROVE THE QUALITY OF YOUR CARE.    I will sign off on a request for help at home  You need  To stop smoking and using alcohol and street drugs  You will be referred to a GI specialist for a colonscopy

## 2010-12-07 NOTE — Progress Notes (Signed)
  Subjective:    Patient ID: Yvonne Clay, female    DOB: 07/15/1955, 55 y.o.   MRN: [id number]  HPI 1 week h/o fever , chills increased chest congestion and cough. Sputum is yellow. Using her inhaler every other day. Reports increased bilateral lower extremity pain, states they are weak, she has repeated near falls at home, and needs someone to help with her bath and other chores in the house. Also reports numbness and spasm in her left upper ext, states she does not feel safe cooking and also needs help with this. Based on history, not objective findings assistance at home will be granted. Pt also c/o intermittent lower abdominal cramping pain, denies overt UTI symptoms, denies any change in bowel movements.    Review of Systems Denies recent fever or chills. Denies sinus pressure, nasal congestion, ear pain or sore throat. Denies chest pains, palpitations and leg swelling Denies  nausea, vomiting,diarrhea or constipation.   Denies dysuria, frequency, hesitancy or incontinence. Denies joint pain, swelling and limitation in mobility. Denies headaches, seizures, numbness, or tingling. Denies uncontrolled  depression, anxiety or insomnia. Denies skin break down or rash.        Objective:   Physical Exam Patient alert and oriented and in no cardiopulmonary distress.  HEENT: No facial asymmetry, EOMI, no sinus tenderness,  oropharynx pink and moist.  Neck supple no adenopathy.  Chest: decreased air entry, bilateral crackles , few wheezes CVS: S1, S2 no murmurs, no S3.  ABD: Soft non tender. Bowel sounds normal.  Ext: No edema  MS: Adequate ROM spine, shoulders, hips and knees.  Skin: Intact, no ulcerations or rash noted.  Psych: Good eye contact, normal affect. Memory intact not anxious or depressed appearing.  CNS: CN 2-12 intact,  tone and sensation normal throughout.Unsteady gait with mild lower extremity weakness. Let upper extremity decreased sensation  reported        Assessment & Plan:

## 2010-12-07 NOTE — Assessment & Plan Note (Signed)
Reports increased spasm with inability to safely cook, will assist help at home

## 2010-12-07 NOTE — Assessment & Plan Note (Signed)
Controlled, no change in medication  

## 2010-12-09 LAB — URINE CULTURE

## 2010-12-14 ENCOUNTER — Encounter: Payer: Medicaid Other | Admitting: Family Medicine

## 2010-12-16 ENCOUNTER — Encounter: Payer: Self-pay | Admitting: Family Medicine

## 2011-01-01 ENCOUNTER — Telehealth: Payer: Self-pay | Admitting: Family Medicine

## 2011-01-01 ENCOUNTER — Other Ambulatory Visit: Payer: Self-pay

## 2011-01-01 DIAGNOSIS — J40 Bronchitis, not specified as acute or chronic: Secondary | ICD-10-CM

## 2011-01-01 MED ORDER — BENZONATATE 100 MG PO CAPS: 100.0000 mg | ORAL_CAPSULE | Freq: Four times a day (QID) | ORAL | Status: DC | PRN

## 2011-01-01 NOTE — Telephone Encounter (Signed)
Resent it in

## 2011-02-25 ENCOUNTER — Encounter (HOSPITAL_COMMUNITY): Payer: Self-pay | Admitting: Emergency Medicine

## 2011-02-25 ENCOUNTER — Emergency Department (HOSPITAL_COMMUNITY)
Admission: EM | Admit: 2011-02-25 | Discharge: 2011-02-25 | Disposition: A | Discharge status: Home / Self Care | Payer: Medicaid Other | Attending: Emergency Medicine | Admitting: Emergency Medicine

## 2011-02-25 ENCOUNTER — Emergency Department (HOSPITAL_COMMUNITY): Payer: Medicaid Other

## 2011-02-25 DIAGNOSIS — R300 Dysuria: Secondary | ICD-10-CM | POA: Insufficient documentation

## 2011-02-25 DIAGNOSIS — N390 Urinary tract infection, site not specified: Secondary | ICD-10-CM | POA: Insufficient documentation

## 2011-02-25 DIAGNOSIS — R059 Cough, unspecified: Secondary | ICD-10-CM | POA: Insufficient documentation

## 2011-02-25 DIAGNOSIS — R509 Fever, unspecified: Secondary | ICD-10-CM | POA: Insufficient documentation

## 2011-02-25 DIAGNOSIS — R3989 Other symptoms and signs involving the genitourinary system: Secondary | ICD-10-CM | POA: Insufficient documentation

## 2011-02-25 DIAGNOSIS — R319 Hematuria, unspecified: Secondary | ICD-10-CM | POA: Insufficient documentation

## 2011-02-25 DIAGNOSIS — R0602 Shortness of breath: Secondary | ICD-10-CM | POA: Insufficient documentation

## 2011-02-25 DIAGNOSIS — R05 Cough: Secondary | ICD-10-CM | POA: Insufficient documentation

## 2011-02-25 DIAGNOSIS — F17200 Nicotine dependence, unspecified, uncomplicated: Secondary | ICD-10-CM | POA: Insufficient documentation

## 2011-02-25 DIAGNOSIS — J189 Pneumonia, unspecified organism: Secondary | ICD-10-CM | POA: Insufficient documentation

## 2011-02-25 DIAGNOSIS — R350 Frequency of micturition: Secondary | ICD-10-CM | POA: Insufficient documentation

## 2011-02-25 LAB — URINALYSIS, ROUTINE W REFLEX MICROSCOPIC
Protein, ur: 100 mg/dL — AB
Urobilinogen, UA: >8 mg/dL — ABNORMAL HIGH (ref 0.0–1.0)

## 2011-02-25 LAB — URINE MICROSCOPIC-ADD ON

## 2011-02-25 MED ORDER — AZITHROMYCIN 250 MG PO TABS: ORAL_TABLET | ORAL | Status: DC

## 2011-02-25 MED ORDER — PHENAZOPYRIDINE HCL 200 MG PO TABS: 200.0000 mg | ORAL_TABLET | Freq: Three times a day (TID) | ORAL | Status: AC | PRN

## 2011-02-25 MED ORDER — LIDOCAINE HCL (PF) 1 % IJ SOLN
2.1000 mL | Freq: Once | INTRAMUSCULAR | Status: AC
  Administered 2011-02-25: 2.1 mL
  Filled 2011-02-25: qty 5

## 2011-02-25 MED ORDER — NITROFURANTOIN MONOHYD MACRO 100 MG PO CAPS
100.0000 mg | ORAL_CAPSULE | Freq: Once | ORAL | Status: AC
  Administered 2011-02-25: 100 mg via ORAL
  Filled 2011-02-25: qty 1

## 2011-02-25 MED ORDER — CEFTRIAXONE SODIUM 1 G IJ SOLR
1.0000 g | Freq: Once | INTRAMUSCULAR | Status: AC
  Administered 2011-02-25: 1 g via INTRAMUSCULAR
  Filled 2011-02-25: qty 1

## 2011-02-25 MED ORDER — PHENAZOPYRIDINE HCL 100 MG PO TABS
200.0000 mg | ORAL_TABLET | Freq: Once | ORAL | Status: AC
  Administered 2011-02-25: 200 mg via ORAL
  Filled 2011-02-25: qty 2

## 2011-02-25 MED ORDER — AZITHROMYCIN 250 MG PO TABS
500.0000 mg | ORAL_TABLET | Freq: Once | ORAL | Status: AC
  Administered 2011-02-25: 500 mg via ORAL
  Filled 2011-02-25: qty 2

## 2011-02-25 MED ORDER — NITROFURANTOIN MONOHYD MACRO 100 MG PO CAPS: 100.0000 mg | ORAL_CAPSULE | Freq: Two times a day (BID) | ORAL | Status: AC

## 2011-02-25 NOTE — ED Provider Notes (Signed)
History     CSN: [id number] Arrival date & time: 02/25/2011  9:44 AM   First MD Initiated Contact with Patient 02/25/11 [phone]      Chief Complaint  Patient presents with  . Cough  . Shortness of Breath  . Dysuria    (Consider location/radiation/quality/duration/timing/severity/associated sxs/prior treatment) Patient is a 55 y.o. female presenting with cough, shortness of breath, and dysuria. The history is provided by the patient. No language interpreter was used.  Cough This is a new problem. The current episode started more than 1 week ago. The problem occurs every few minutes. The problem has not changed since onset.The cough is productive of sputum. The fever has been present for 1 to 2 days (subjective fever.). Associated symptoms include chills and shortness of breath. Pertinent negatives include no wheezing. She has tried nothing for the symptoms. She is a smoker.  Shortness of Breath  Associated symptoms include a fever, cough and shortness of breath. Pertinent negatives include no stridor and no wheezing.  Dysuria  Associated symptoms include chills, frequency and hematuria.    Past Medical History  Diagnosis Date  . Substance abuse   . Depression   . COPD (chronic obstructive pulmonary disease)   . Arthritis   . Hepatitis C dx in 2011 by dentist    History reviewed. No pertinent past surgical history.  History reviewed. No pertinent family history.  History  Substance Use Topics  . Smoking status: Current Everyday Smoker -- 0.3 packs/day  . Smokeless tobacco: Not on file  . Alcohol Use: 0.0 oz/week     patient states she drinks about two forty oz drinks a week    OB History    Grav Para Term Preterm Abortions TAB SAB Ect Mult Living                  Review of Systems  Constitutional: Positive for fever and chills.  Respiratory: Positive for cough and shortness of breath. Negative for choking, wheezing and stridor.   Genitourinary: Positive for dysuria,  frequency, hematuria and decreased urine volume.  All other systems reviewed and are negative.    Allergies  Penicillins  Home Medications   Current Outpatient Rx  Name Route Sig Dispense Refill  . ALBUTEROL SULFATE HFA 108 (90 BASE) MCG/ACT IN AERS Inhalation Inhale 2 puffs into the lungs. Every 6-8 hours as needed     . BENZONATATE 100 MG PO CAPS Oral Take 1 capsule (100 mg total) by mouth every 6 (six) hours as needed for cough. 30 capsule 0  . CYCLOBENZAPRINE HCL 10 MG PO TABS Oral Take 1 tablet (10 mg total) by mouth at bedtime as needed for muscle spasms. Take one tablet by mouth at bedtime as needed 30 tablet 1  . FLUOXETINE HCL 10 MG PO CAPS Oral Take 10 mg by mouth daily.      Marland Kitchen NAPROXEN 500 MG PO TABS Oral Take 500 mg by mouth. Take one tablet by mouth two times a day . Discontinue Ibuprofen     . OMEPRAZOLE 20 MG PO TBEC Oral Take by mouth daily.      Marland Kitchen TEMAZEPAM 15 MG PO CAPS Oral Take 15 mg by mouth at bedtime as needed.      Marland Kitchen TRAMADOL HCL 50 MG PO TABS Oral Take 50 mg by mouth once as needed.        BP 111/73  Pulse 93  Temp(Src) 98.6 F (37 C) (Oral)  Ht 5' 4.5" (1.638 m)  Wt 138 lb (62.596 kg)  BMI 23.32 kg/m2  SpO2 97%  Physical Exam  Nursing note and vitals reviewed. Constitutional: She is oriented to person, place, and time. She appears well-developed and well-nourished. No distress.  HENT:  Head: Normocephalic and atraumatic.  Eyes: EOM are normal.  Neck: Normal range of motion.  Cardiovascular: Normal rate, regular rhythm and normal heart sounds.   Pulmonary/Chest: Effort normal and breath sounds normal. No respiratory distress. She has no wheezes. She has no rales. She exhibits no tenderness.  Abdominal: Soft. She exhibits no distension. There is no tenderness. There is no rigidity, no guarding, no CVA tenderness, no tenderness at McBurney's point and negative Murphy's sign.    Genitourinary: No vaginal discharge found.  Musculoskeletal: Normal range  of motion.  Neurological: She is alert and oriented to person, place, and time.  Skin: Skin is warm and dry.  Psychiatric: She has a normal mood and affect. Judgment normal.    ED Course  Procedures (including critical care time)  Labs Reviewed - No data to display No results found.   No diagnosis found.    MDM         Worthy Rancher, PA 02/25/11 [phone]

## 2011-02-25 NOTE — ED Notes (Signed)
Pt sitting in chair at bedside. Pt waiting for discharge.

## 2011-02-25 NOTE — ED Notes (Signed)
Pt c/o body aches, cough, congestion, poor po intake, dysuria x one week.

## 2011-02-25 NOTE — ED Notes (Signed)
Pt c/o painful urination and states that it feels like a knife cutting her when she urinates. Also c/o productive cough with yellow sputum and aching all over. Pt alert and oriented x 3. Skin warm and dry. Color pink. Breath sounds equal bilaterally with expiratory wheezing noted. Ambulates without difficulty.

## 2011-02-26 LAB — URINE CULTURE: Colony Count: 7000

## 2011-03-02 NOTE — ED Provider Notes (Signed)
Medical screening examination/treatment/procedure(s) were performed by non-physician practitioner and as supervising physician I was immediately available for consultation/collaboration.  Donnetta Hutching, MD 03/02/11 [phone]

## 2011-03-22 ENCOUNTER — Other Ambulatory Visit: Payer: Self-pay | Admitting: Family Medicine

## 2011-03-22 ENCOUNTER — Encounter: Payer: Self-pay | Admitting: Family Medicine

## 2011-03-22 DIAGNOSIS — Z139 Encounter for screening, unspecified: Secondary | ICD-10-CM

## 2011-03-23 ENCOUNTER — Ambulatory Visit (HOSPITAL_COMMUNITY): Payer: Medicaid Other

## 2011-03-29 ENCOUNTER — Ambulatory Visit (HOSPITAL_COMMUNITY)
Admission: RE | Admit: 2011-03-29 | Discharge: 2011-03-29 | Disposition: A | Discharge status: Home / Self Care | Payer: Medicaid Other | Source: Ambulatory Visit | Attending: Family Medicine | Admitting: Family Medicine

## 2011-03-29 DIAGNOSIS — Z139 Encounter for screening, unspecified: Secondary | ICD-10-CM

## 2011-03-29 DIAGNOSIS — Z1231 Encounter for screening mammogram for malignant neoplasm of breast: Secondary | ICD-10-CM | POA: Insufficient documentation

## 2011-03-30 ENCOUNTER — Ambulatory Visit (INDEPENDENT_AMBULATORY_CARE_PROVIDER_SITE_OTHER): Payer: Medicaid Other | Admitting: Family Medicine

## 2011-03-30 ENCOUNTER — Encounter: Payer: Self-pay | Admitting: Family Medicine

## 2011-03-30 VITALS — BP 122/64 | HR 78 | Temp 98.4°F | Resp 16 | Ht 64.5 in | Wt 128.0 lb

## 2011-03-30 DIAGNOSIS — R911 Solitary pulmonary nodule: Secondary | ICD-10-CM | POA: Insufficient documentation

## 2011-03-30 DIAGNOSIS — F1020 Alcohol dependence, uncomplicated: Secondary | ICD-10-CM

## 2011-03-30 DIAGNOSIS — N390 Urinary tract infection, site not specified: Secondary | ICD-10-CM

## 2011-03-30 DIAGNOSIS — G4700 Insomnia, unspecified: Secondary | ICD-10-CM

## 2011-03-30 DIAGNOSIS — F329 Major depressive disorder, single episode, unspecified: Secondary | ICD-10-CM

## 2011-03-30 DIAGNOSIS — M62838 Other muscle spasm: Secondary | ICD-10-CM

## 2011-03-30 DIAGNOSIS — Z113 Encounter for screening for infections with a predominantly sexual mode of transmission: Secondary | ICD-10-CM

## 2011-03-30 DIAGNOSIS — F1420 Cocaine dependence, uncomplicated: Secondary | ICD-10-CM

## 2011-03-30 DIAGNOSIS — R5383 Other fatigue: Secondary | ICD-10-CM

## 2011-03-30 DIAGNOSIS — R5381 Other malaise: Secondary | ICD-10-CM

## 2011-03-30 DIAGNOSIS — M542 Cervicalgia: Secondary | ICD-10-CM

## 2011-03-30 DIAGNOSIS — J984 Other disorders of lung: Secondary | ICD-10-CM

## 2011-03-30 DIAGNOSIS — R7301 Impaired fasting glucose: Secondary | ICD-10-CM

## 2011-03-30 DIAGNOSIS — Z79899 Other long term (current) drug therapy: Secondary | ICD-10-CM

## 2011-03-30 LAB — POCT URINALYSIS DIPSTICK
Bilirubin, UA: NEGATIVE
Ketones, UA: NEGATIVE
Protein, UA: NEGATIVE
Spec Grav, UA: >=1.03
pH, UA: 5

## 2011-03-30 MED ORDER — CYCLOBENZAPRINE HCL 10 MG PO TABS: 10.0000 mg | ORAL_TABLET | Freq: Every evening | ORAL | Status: DC | PRN

## 2011-03-30 MED ORDER — FLUOXETINE HCL 10 MG PO CAPS: 10.0000 mg | ORAL_CAPSULE | Freq: Every day | ORAL | Status: DC

## 2011-03-30 MED ORDER — TEMAZEPAM 15 MG PO CAPS: 15.0000 mg | ORAL_CAPSULE | Freq: Every evening | ORAL | Status: DC | PRN

## 2011-03-30 MED ORDER — TRAMADOL HCL 50 MG PO TABS: 50.0000 mg | ORAL_TABLET | Freq: Once | ORAL | Status: DC | PRN

## 2011-03-30 MED ORDER — NAPROXEN 500 MG PO TABS: ORAL_TABLET | ORAL | Status: DC

## 2011-03-30 MED ORDER — ALBUTEROL SULFATE HFA 108 (90 BASE) MCG/ACT IN AERS: INHALATION_SPRAY | RESPIRATORY_TRACT | Status: DC

## 2011-03-30 NOTE — Assessment & Plan Note (Signed)
Sleep hygiene reviewed, needs to resume medication

## 2011-03-30 NOTE — Progress Notes (Signed)
  Subjective:    Patient ID: Yvonne Clay, female    DOB: Feb 06, 1956, 55 y.o.   MRN: [id number]  HPI Pt originally scheduled for complete exam , but is refusing. She was recently seen in the Ed for pneumonia and UTI and wants to f/u on these. States she no longer has chills, fever or significant cough. Feels well, and denies dysuria but hs mild frequency. C/o neck pain and spasm   Review of Systems    See HPI Denies recent fever or chills. Denies sinus pressure, nasal congestion, ear pain or sore throat. Denies chest congestion, productive cough or wheezing. Denies chest pains, palpitations and leg swelling Denies abdominal pain, nausea, vomiting,diarrhea or constipation.   Denies dysuria, frequency, hesitancy or incontinence.  Denies headaches, seizures, numbness, or tingling. c/o depression and  anxiety  Denies crying spells, suicidal or homicidal ideation, wants med refilled Denies skin break down or rash.     Objective:   Physical Exam Patient alert and oriented and in no cardiopulmonary distress.  HEENT: No facial asymmetry, EOMI, no sinus tenderness,  oropharynx pink and moist.  Neck  Decreased ROM with spasm no adenopathy.  Chest: Clear to auscultation bilaterally.  CVS: S1, S2 no murmurs, no S3.  ABD: Soft non tender. Bowel sounds normal.  Ext: No edema  MS: Adequate ROM spine, shoulders, hips and knees.  Skin: Intact, no ulcerations or rash noted.  Psych: Good eye contact, normal affect. Memory intact not anxious or depressed appearing.  CNS: CN 2-12 intact, power, tone and sensation normal throughout.        Assessment & Plan:

## 2011-03-30 NOTE — Assessment & Plan Note (Signed)
Daily alcohol use , counseled to quit

## 2011-03-30 NOTE — Assessment & Plan Note (Signed)
Continues to use drug, and [place] losing weight

## 2011-03-30 NOTE — Assessment & Plan Note (Signed)
Uncontrolled, not suicidal or homicidal, needs to resume med

## 2011-03-30 NOTE — Patient Instructions (Signed)
F/u in 3 months. You  Need to stop alcohol and drug use, and improve your eating habits.  Labs today and a CXR. You do need a flu vaccine

## 2011-04-04 DIAGNOSIS — N390 Urinary tract infection, site not specified: Secondary | ICD-10-CM | POA: Insufficient documentation

## 2011-04-04 NOTE — Assessment & Plan Note (Signed)
Mildly symptomatic, rept UA and c/s

## 2011-04-04 NOTE — Assessment & Plan Note (Signed)
Unchanged, muscle relaxant and pain med as before

## 2011-04-04 NOTE — Assessment & Plan Note (Signed)
Needs rept imaging study

## 2011-04-05 ENCOUNTER — Telehealth: Payer: Self-pay

## 2011-04-05 NOTE — Telephone Encounter (Signed)
Message copied by Anthoney Harada on Mon Apr 05, 2011 11:23 AM ------      Message from: Syliva Overman MD E      Created: Sun Apr 04, 2011  8:59 AM      Regarding: needs cxr       pls call pt and remind her she needs the cxr done , this was ordered since her visit last week and still not done

## 2011-04-12 ENCOUNTER — Ambulatory Visit (HOSPITAL_COMMUNITY)
Admission: RE | Admit: 2011-04-12 | Discharge: 2011-04-12 | Disposition: A | Discharge status: Home / Self Care | Payer: Medicaid Other | Source: Ambulatory Visit | Attending: Family Medicine | Admitting: Family Medicine

## 2011-04-12 DIAGNOSIS — J189 Pneumonia, unspecified organism: Secondary | ICD-10-CM | POA: Insufficient documentation

## 2011-04-12 DIAGNOSIS — R05 Cough: Secondary | ICD-10-CM | POA: Insufficient documentation

## 2011-04-12 DIAGNOSIS — R911 Solitary pulmonary nodule: Secondary | ICD-10-CM

## 2011-04-12 DIAGNOSIS — Z09 Encounter for follow-up examination after completed treatment for conditions other than malignant neoplasm: Secondary | ICD-10-CM | POA: Insufficient documentation

## 2011-04-12 DIAGNOSIS — R059 Cough, unspecified: Secondary | ICD-10-CM | POA: Insufficient documentation

## 2011-04-12 DIAGNOSIS — F17200 Nicotine dependence, unspecified, uncomplicated: Secondary | ICD-10-CM | POA: Insufficient documentation

## 2011-04-13 LAB — LIPID PANEL
Cholesterol: 141 mg/dL (ref 0–200)
HDL: 75 mg/dL (ref >39)
Total CHOL/HDL Ratio: 1.9 Ratio
Triglycerides: 74 mg/dL (ref <150)
VLDL: 15 mg/dL (ref 0–40)

## 2011-04-13 LAB — HEPATIC FUNCTION PANEL
ALT: 43 U/L — ABNORMAL HIGH (ref 0–35)
AST: 70 U/L — ABNORMAL HIGH (ref 0–37)
Albumin: 4.3 g/dL (ref 3.5–5.2)
Total Protein: 8 g/dL (ref 6.0–8.3)

## 2011-04-13 LAB — CBC WITH DIFFERENTIAL/PLATELET
Basophils Relative: 0 % (ref 0–1)
Eosinophils Absolute: 0.2 10*3/uL (ref 0.0–0.7)
Hemoglobin: 14.8 g/dL (ref 12.0–15.0)
MCHC: 32.4 g/dL (ref 30.0–36.0)
Monocytes Relative: 8 % (ref 3–12)
Neutro Abs: 1.5 10*3/uL — ABNORMAL LOW (ref 1.7–7.7)
Neutrophils Relative %: 32 % — ABNORMAL LOW (ref 43–77)
Platelets: 248 10*3/uL (ref 150–400)
RBC: 4.4 MIL/uL (ref 3.87–5.11)

## 2011-04-13 LAB — BASIC METABOLIC PANEL
Calcium: 9.7 mg/dL (ref 8.4–10.5)
Glucose, Bld: 80 mg/dL (ref 70–99)
Potassium: 4.8 mEq/L (ref 3.5–5.3)
Sodium: 141 mEq/L (ref 135–145)

## 2011-04-13 LAB — HEMOGLOBIN A1C: Hgb A1c MFr Bld: 5.1 % (ref <5.7)

## 2011-04-13 LAB — TSH: TSH: 1.583 u[IU]/mL (ref 0.350–4.500)

## 2011-04-19 NOTE — Progress Notes (Signed)
Ph disconnected. Mailed letter to call office

## 2011-05-19 ENCOUNTER — Other Ambulatory Visit: Payer: Self-pay

## 2011-05-19 DIAGNOSIS — J40 Bronchitis, not specified as acute or chronic: Secondary | ICD-10-CM

## 2011-05-19 DIAGNOSIS — J449 Chronic obstructive pulmonary disease, unspecified: Secondary | ICD-10-CM

## 2011-05-25 ENCOUNTER — Ambulatory Visit (HOSPITAL_COMMUNITY)
Admission: RE | Admit: 2011-05-25 | Discharge: 2011-05-25 | Disposition: A | Discharge status: Home / Self Care | Payer: Medicaid Other | Source: Ambulatory Visit | Attending: Family Medicine | Admitting: Family Medicine

## 2011-05-25 DIAGNOSIS — J449 Chronic obstructive pulmonary disease, unspecified: Secondary | ICD-10-CM | POA: Insufficient documentation

## 2011-05-25 DIAGNOSIS — J4489 Other specified chronic obstructive pulmonary disease: Secondary | ICD-10-CM | POA: Insufficient documentation

## 2011-05-25 DIAGNOSIS — J40 Bronchitis, not specified as acute or chronic: Secondary | ICD-10-CM | POA: Insufficient documentation

## 2011-06-03 ENCOUNTER — Ambulatory Visit (INDEPENDENT_AMBULATORY_CARE_PROVIDER_SITE_OTHER): Payer: Medicaid Other | Admitting: Family Medicine

## 2011-06-03 ENCOUNTER — Telehealth: Payer: Self-pay | Admitting: Family Medicine

## 2011-06-03 ENCOUNTER — Encounter: Payer: Self-pay | Admitting: Family Medicine

## 2011-06-03 VITALS — BP 128/72 | HR 92 | Resp 18 | Ht 64.5 in | Wt 130.1 lb

## 2011-06-03 DIAGNOSIS — N390 Urinary tract infection, site not specified: Secondary | ICD-10-CM

## 2011-06-03 DIAGNOSIS — F1420 Cocaine dependence, uncomplicated: Secondary | ICD-10-CM

## 2011-06-03 LAB — POCT URINALYSIS DIPSTICK
Blood, UA: NEGATIVE
Glucose, UA: NEGATIVE
Nitrite, UA: NEGATIVE
Protein, UA: NEGATIVE
Spec Grav, UA: <=1.005
Urobilinogen, UA: 0.2

## 2011-06-03 MED ORDER — CEPHALEXIN 500 MG PO CAPS: 500.0000 mg | ORAL_CAPSULE | Freq: Four times a day (QID) | ORAL | Status: AC

## 2011-06-03 MED ORDER — PHENAZOPYRIDINE HCL 100 MG PO TABS: 100.0000 mg | ORAL_TABLET | Freq: Three times a day (TID) | ORAL | Status: AC | PRN

## 2011-06-03 MED ORDER — KETOROLAC TROMETHAMINE 60 MG/2ML IJ SOLN
60.0000 mg | Freq: Once | INTRAMUSCULAR | Status: AC
  Administered 2011-06-03: 60 mg via INTRAMUSCULAR

## 2011-06-03 MED ORDER — CEFTRIAXONE SODIUM 1 G IJ SOLR
1.0000 g | Freq: Once | INTRAMUSCULAR | Status: AC
  Administered 2011-06-03: 1 g via INTRAMUSCULAR

## 2011-06-03 NOTE — Telephone Encounter (Signed)
Nurse visit only ccua

## 2011-06-03 NOTE — Telephone Encounter (Signed)
Pt states that she went to the ed for urinary tract infection and was given abt.  She states that she believes the infection is back. Please advise.

## 2011-06-03 NOTE — Patient Instructions (Signed)
Take the antibiotics as prescribed You have been given a shot of toradol for pain Take the pyridium, this can turn your urine orange We will call if the antibiotic needs to be changed  Urinary Tract Infection Infections of the urinary tract can start in several places. A bladder infection (cystitis), a kidney infection (pyelonephritis), and a prostate infection (prostatitis) are different types of urinary tract infections (UTIs). They usually get better if treated with medicines (antibiotics) that kill germs. Take all the medicine until it is gone. You or your child may feel better in a few days, but TAKE ALL MEDICINE or the infection may not respond and may become more difficult to treat. HOME CARE INSTRUCTIONS    Drink enough water and fluids to keep the urine clear or pale yellow. Cranberry juice is especially recommended, in addition to large amounts of water.     Avoid caffeine, tea, and carbonated beverages. They tend to irritate the bladder.     Alcohol may irritate the prostate.     Only take over-the-counter or prescription medicines for pain, discomfort, or fever as directed by your caregiver.  To prevent further infections:  Empty the bladder often. Avoid holding urine for long periods of time.     After a bowel movement, women should cleanse from front to back. Use each tissue only once.     Empty the bladder before and after sexual intercourse.  FINDING OUT THE RESULTS OF YOUR TEST Not all test results are available during your visit. If your or your child's test results are not back during the visit, make an appointment with your caregiver to find out the results. Do not assume everything is normal if you have not heard from your caregiver or the medical facility. It is important for you to follow up on all test results. SEEK MEDICAL CARE IF:    There is back pain.     Your baby is older than 3 months with a rectal temperature of 100.5 F (38.1 C) or higher for more than 1  day.     Your or your child's problems (symptoms) are no better in 3 days. Return sooner if you or your child is getting worse.  SEEK IMMEDIATE MEDICAL CARE IF:    There is severe back pain or lower abdominal pain.     You or your child develops chills.     You have a fever.     Your baby is older than 3 months with a rectal temperature of 102 F (38.9 C) or higher.     Your baby is 51 months old or younger with a rectal temperature of 100.4 F (38 C) or higher.     There is nausea or vomiting.     There is continued burning or discomfort with urination.  MAKE SURE YOU:    Understand these instructions.     Will watch your condition.     Will get help right away if you are not doing well or get worse.  Document Released: 01/27/2005 Document Revised: 12/30/2010 Document Reviewed: 09/01/2006 [facility] Patient Information 2012 [place], [place].

## 2011-06-03 NOTE — Telephone Encounter (Signed)
Pt in for visit due to not being seen in ed more than 2 weeks

## 2011-06-04 ENCOUNTER — Encounter: Payer: Self-pay | Admitting: Family Medicine

## 2011-06-04 ENCOUNTER — Telehealth: Payer: Self-pay | Admitting: Family Medicine

## 2011-06-04 ENCOUNTER — Other Ambulatory Visit: Payer: Self-pay

## 2011-06-04 MED ORDER — ALBUTEROL SULFATE HFA 108 (90 BASE) MCG/ACT IN AERS: INHALATION_SPRAY | RESPIRATORY_TRACT | Status: DC

## 2011-06-04 NOTE — Assessment & Plan Note (Signed)
Rocephin IM given in clinic, pt to complete course of Keflex, will treat for recurrent UTI and increase dose to total 2000mg  a day. Pyridium for discomfort. Given shot of Toradol for pain in clinic.

## 2011-06-04 NOTE — Telephone Encounter (Signed)
Already sent in

## 2011-06-04 NOTE — Progress Notes (Signed)
  Subjective:    Patient ID: Yvonne Clay, female    DOB: [birth year], 56 y.o.   MRN: [id number]  HPI  Patient presents with painful urination x1 week. She has some burning as well as a has pelvic pressure. She was treated for a UTI approximately one month ago but does not remember the antibiotic. She states she did get well however it returned. She denies any vaginal discharge, abdominal pain, nausea vomiting. She denies fever  She continues to drink alcohol and use cocaine Review of Systems - per above     Objective:   Physical Exam GEN- NAD, alert and oriented x3, appears under the influence CVS- RRR, no murmur RESP-CTAB ABD-NABS, soft, mild TTP suprapubic region, no rebound no gaurding,  EXT- No edema Pulses- Radial, DP- 2+ Neuro- no tremor, speech normal        Assessment & Plan:

## 2011-06-04 NOTE — Assessment & Plan Note (Signed)
Pt continues to use substances, she was asked about this in the clinic due to her state. She states she last used 1 week ago, but drank a lot yesterday. No signs of withdrawel. Advised pt to quit

## 2011-06-07 LAB — URINE CULTURE: Colony Count: 25000

## 2011-06-30 ENCOUNTER — Encounter: Payer: Self-pay | Admitting: Family Medicine

## 2011-06-30 ENCOUNTER — Ambulatory Visit: Payer: Medicaid Other | Admitting: Family Medicine

## 2011-07-05 ENCOUNTER — Ambulatory Visit: Payer: Medicaid Other | Admitting: Family Medicine

## 2011-08-10 ENCOUNTER — Ambulatory Visit: Payer: Medicaid Other | Admitting: Family Medicine

## 2011-08-11 ENCOUNTER — Other Ambulatory Visit (HOSPITAL_COMMUNITY)
Admission: RE | Admit: 2011-08-11 | Discharge: 2011-08-11 | Disposition: A | Discharge status: Home / Self Care | Payer: Medicaid Other | Source: Ambulatory Visit | Attending: Family Medicine | Admitting: Family Medicine

## 2011-08-11 ENCOUNTER — Ambulatory Visit (INDEPENDENT_AMBULATORY_CARE_PROVIDER_SITE_OTHER): Payer: Medicaid Other | Admitting: Family Medicine

## 2011-08-11 ENCOUNTER — Encounter: Payer: Self-pay | Admitting: Family Medicine

## 2011-08-11 VITALS — BP 112/74 | HR 96 | Resp 16 | Ht 64.5 in | Wt 126.4 lb

## 2011-08-11 DIAGNOSIS — Z0000 Encounter for general adult medical examination without abnormal findings: Secondary | ICD-10-CM

## 2011-08-11 DIAGNOSIS — J449 Chronic obstructive pulmonary disease, unspecified: Secondary | ICD-10-CM

## 2011-08-11 DIAGNOSIS — N760 Acute vaginitis: Secondary | ICD-10-CM

## 2011-08-11 DIAGNOSIS — Z01419 Encounter for gynecological examination (general) (routine) without abnormal findings: Secondary | ICD-10-CM | POA: Insufficient documentation

## 2011-08-11 DIAGNOSIS — Z124 Encounter for screening for malignant neoplasm of cervix: Secondary | ICD-10-CM

## 2011-08-11 DIAGNOSIS — Z1211 Encounter for screening for malignant neoplasm of colon: Secondary | ICD-10-CM

## 2011-08-11 DIAGNOSIS — F1020 Alcohol dependence, uncomplicated: Secondary | ICD-10-CM

## 2011-08-11 DIAGNOSIS — F1420 Cocaine dependence, uncomplicated: Secondary | ICD-10-CM

## 2011-08-11 DIAGNOSIS — Z113 Encounter for screening for infections with a predominantly sexual mode of transmission: Secondary | ICD-10-CM

## 2011-08-11 DIAGNOSIS — B1710 Acute hepatitis C without hepatic coma: Secondary | ICD-10-CM

## 2011-08-11 LAB — POC HEMOCCULT BLD/STL (OFFICE/1-CARD/DIAGNOSTIC): Fecal Occult Blood, POC: NEGATIVE

## 2011-08-11 NOTE — Assessment & Plan Note (Signed)
Counseled to quit, unchanged

## 2011-08-11 NOTE — Patient Instructions (Signed)
F/u in 5.5 months  HSVtype 2 blood test today.  You need to stop smoking  And using illicit drugs.  You will be contacted if any of your tests are abnormal

## 2011-08-11 NOTE — Progress Notes (Signed)
  Subjective:    Patient ID: Yvonne Clay, female    DOB: 08-03-1955, 56 y.o.   MRN: [id number]  HPI The PT is here for annual exam and re-evaluation of chronic medical conditions, medication management and review of any available recent lab and radiology data.  Preventive health is updated, specifically  Cancer screening and Immunization.   2 month h/o vaginal itch, no interest in intercourse since painful.Denies vaginal discharge Still drinking, smoking and using cocaine, no plan to quit     Review of Systems See HPI Denies recent fever or chills. Denies sinus pressure, nasal congestion, ear pain or sore throat. Denies chest congestion, productive cough or wheezing. Denies chest pains, palpitations and leg swelling Denies abdominal pain, nausea, vomiting,diarrhea or constipation.   Denies dysuria, frequency, hesitancy or incontinence. Denies joint pain, swelling and limitation in mobility. Denies headaches, seizures, numbness, or tingling. Denies depression, anxiety or insomnia. Denies skin break down or rash.        Objective:   Physical Exam  Pleasant  female, alert and oriented x 3, in no cardio-pulmonary distress. Afebrile. HEENT No facial trauma or asymetry. Sinuses non tender.  EOMI, PERTL, fundoscopic exam i no hemorhage or exudate.  External ears normal, tympanic membranes clear. Oropharynx moist, no exudate, no dentition.All teeth removed Neck: supple, no adenopathy,JVD or thyromegaly.No bruits.  Chest: Clear to ascultation bilaterally.No crackles or wheezes. Non tender to palpation  Breast: No asymetry,no masses. No nipple discharge or inversion. No axillary or supraclavicular adenopathy  Cardiovascular system; Heart sounds normal,  S1 and  S2 ,no S3.  No murmur, or thrill. Apical beat not displaced Peripheral pulses normal.  Abdomen: Soft, non tender, no organomegaly or masses. No bruits. Bowel sounds normal. No guarding, tenderness or  rebound.  Rectal:  No mass. Guaiac negative stool.  GU: External genitalia normal. No lesions. Vaginal canal normal.No discharge. Uterus normal size, no adnexal masses, no cervical motion or adnexal tenderness.  Musculoskeletal exam: Full ROM of spine, hips , shoulders and knees. No deformity ,swelling or crepitus noted. No muscle wasting or atrophy.   Neurologic: Cranial nerves 2 to 12 intact. Power, tone ,sensation and reflexes normal throughout. No disturbance in gait. No tremor.  Skin: Intact, no ulceration, erythema , scaling or rash noted. Pigmentation normal throughout  Psych; Normal mood and affect. Judgement and concentration normal       Assessment & Plan:

## 2011-08-11 NOTE — Assessment & Plan Note (Signed)
Has been referred to GI over 1 year ago, still has not followed up

## 2011-08-11 NOTE — Assessment & Plan Note (Signed)
Specimen sent.

## 2011-08-11 NOTE — Assessment & Plan Note (Signed)
Unchanged behavior, cessation counseling done

## 2011-08-11 NOTE — Assessment & Plan Note (Signed)
Deteriorating, ongoing nicotine use, counseled to quit

## 2011-08-12 LAB — GC/CHLAMYDIA PROBE AMP, GENITAL
Chlamydia, DNA Probe: NEGATIVE
GC Probe Amp, Genital: NEGATIVE

## 2011-08-12 LAB — HSV 2 ANTIBODY, IGG: HSV 2 Glycoprotein G Ab, IgG: 6.13 IV — ABNORMAL HIGH

## 2011-08-13 ENCOUNTER — Other Ambulatory Visit: Payer: Self-pay | Admitting: Family Medicine

## 2011-08-13 MED ORDER — ACYCLOVIR 400 MG PO TABS: 400.0000 mg | ORAL_TABLET | Freq: Three times a day (TID) | ORAL | Status: AC

## 2011-08-13 MED ORDER — METRONIDAZOLE 500 MG PO TABS: ORAL_TABLET | ORAL | Status: DC

## 2011-08-14 ENCOUNTER — Emergency Department (HOSPITAL_COMMUNITY)
Admission: EM | Admit: 2011-08-14 | Discharge: 2011-08-14 | Disposition: A | Discharge status: Home / Self Care | Payer: Medicaid Other | Attending: Emergency Medicine | Admitting: Emergency Medicine

## 2011-08-14 ENCOUNTER — Encounter (HOSPITAL_COMMUNITY): Payer: Self-pay | Admitting: *Deleted

## 2011-08-14 DIAGNOSIS — F3289 Other specified depressive episodes: Secondary | ICD-10-CM | POA: Insufficient documentation

## 2011-08-14 DIAGNOSIS — F17200 Nicotine dependence, unspecified, uncomplicated: Secondary | ICD-10-CM | POA: Insufficient documentation

## 2011-08-14 DIAGNOSIS — J4489 Other specified chronic obstructive pulmonary disease: Secondary | ICD-10-CM | POA: Insufficient documentation

## 2011-08-14 DIAGNOSIS — Z8619 Personal history of other infectious and parasitic diseases: Secondary | ICD-10-CM | POA: Insufficient documentation

## 2011-08-14 DIAGNOSIS — N949 Unspecified condition associated with female genital organs and menstrual cycle: Secondary | ICD-10-CM | POA: Insufficient documentation

## 2011-08-14 DIAGNOSIS — R300 Dysuria: Secondary | ICD-10-CM | POA: Insufficient documentation

## 2011-08-14 DIAGNOSIS — R10819 Abdominal tenderness, unspecified site: Secondary | ICD-10-CM | POA: Insufficient documentation

## 2011-08-14 DIAGNOSIS — F329 Major depressive disorder, single episode, unspecified: Secondary | ICD-10-CM | POA: Insufficient documentation

## 2011-08-14 DIAGNOSIS — R350 Frequency of micturition: Secondary | ICD-10-CM | POA: Insufficient documentation

## 2011-08-14 DIAGNOSIS — J449 Chronic obstructive pulmonary disease, unspecified: Secondary | ICD-10-CM | POA: Insufficient documentation

## 2011-08-14 DIAGNOSIS — N390 Urinary tract infection, site not specified: Secondary | ICD-10-CM | POA: Insufficient documentation

## 2011-08-14 DIAGNOSIS — Z79899 Other long term (current) drug therapy: Secondary | ICD-10-CM | POA: Insufficient documentation

## 2011-08-14 DIAGNOSIS — R3915 Urgency of urination: Secondary | ICD-10-CM | POA: Insufficient documentation

## 2011-08-14 DIAGNOSIS — M129 Arthropathy, unspecified: Secondary | ICD-10-CM | POA: Insufficient documentation

## 2011-08-14 LAB — URINE MICROSCOPIC-ADD ON

## 2011-08-14 LAB — URINALYSIS, ROUTINE W REFLEX MICROSCOPIC
Nitrite: NEGATIVE
Specific Gravity, Urine: >1.03 — ABNORMAL HIGH (ref 1.005–1.030)
Urobilinogen, UA: 1 mg/dL (ref 0.0–1.0)
pH: 5 (ref 5.0–8.0)

## 2011-08-14 MED ORDER — CIPROFLOXACIN HCL 500 MG PO TABS: 500.0000 mg | ORAL_TABLET | Freq: Two times a day (BID) | ORAL | Status: AC

## 2011-08-14 MED ORDER — PHENAZOPYRIDINE HCL 200 MG PO TABS: 200.0000 mg | ORAL_TABLET | Freq: Three times a day (TID) | ORAL | Status: AC

## 2011-08-14 MED ORDER — PHENAZOPYRIDINE HCL 100 MG PO TABS
200.0000 mg | ORAL_TABLET | Freq: Once | ORAL | Status: AC
  Administered 2011-08-14: 200 mg via ORAL
  Filled 2011-08-14: qty 2

## 2011-08-14 MED ORDER — CIPROFLOXACIN HCL 250 MG PO TABS
500.0000 mg | ORAL_TABLET | Freq: Once | ORAL | Status: AC
  Administered 2011-08-14: 500 mg via ORAL
  Filled 2011-08-14: qty 2

## 2011-08-14 NOTE — ED Provider Notes (Signed)
History     CSN: [id number]  Arrival date & time 08/14/11  1442   First MD Initiated Contact with Patient 08/14/11 [phone]      Chief Complaint  Patient presents with  . Vaginal Pain    (Consider location/radiation/quality/duration/timing/severity/associated sxs/prior treatment) Patient is a 56 y.o. female presenting with dysuria. The history is provided by the patient.  Dysuria  This is a new problem. The current episode started 2 days ago. The problem occurs every urination. The problem has not changed since onset.The quality of the pain is described as burning. The pain is mild. There has been no fever. She is sexually active. Associated symptoms include frequency and urgency. Pertinent negatives include no chills, no sweats, no nausea, no vomiting, no discharge, no hematuria, no hesitancy, no possible pregnancy and no flank pain. She has tried nothing for the symptoms. Her past medical history is significant for recurrent UTIs.    Past Medical History  Diagnosis Date  . Substance abuse   . Depression   . COPD (chronic obstructive pulmonary disease)   . Arthritis   . Hepatitis C dx in 2011 by dentist    Past Surgical History  Procedure Date  . Abdominal hysterectomy     History reviewed. No pertinent family history.  History  Substance Use Topics  . Smoking status: Current Everyday Smoker -- 0.3 packs/day  . Smokeless tobacco: Not on file  . Alcohol Use: 0.0 oz/week     patient states she drinks about two forty oz drinks a week    OB History    Grav Para Term Preterm Abortions TAB SAB Ect Mult Living                  Review of Systems  Constitutional: Negative for chills, activity change and appetite change.  Gastrointestinal: Negative for nausea and vomiting.  Genitourinary: Positive for dysuria, urgency, frequency and vaginal pain. Negative for hesitancy, hematuria, flank pain, vaginal bleeding, vaginal discharge, difficulty urinating and menstrual problem.    Musculoskeletal: Negative.  Negative for back pain.  Neurological: Negative for weakness and numbness.  All other systems reviewed and are negative.    Allergies  Penicillins  Home Medications   Current Outpatient Rx  Name Route Sig Dispense Refill  . ACYCLOVIR 400 MG PO TABS Oral Take 1 tablet (400 mg total) by mouth 3 (three) times daily. 30 tablet 0  . ALBUTEROL SULFATE HFA 108 (90 BASE) MCG/ACT IN AERS  Every 6-8 hours as needed 6.7 g 4  . ASPIRIN EFFERVESCENT 325 MG PO TBEF Oral Take 325 mg by mouth every 6 (six) hours as needed. Patient only took one as it caused her to vomit     . CYCLOBENZAPRINE HCL 10 MG PO TABS Oral Take 1 tablet (10 mg total) by mouth at bedtime as needed for muscle spasms. Take one tablet by mouth at bedtime as needed 30 tablet 4  . FLUOXETINE HCL 10 MG PO CAPS Oral Take 1 capsule (10 mg total) by mouth daily. 30 capsule 4  . METRONIDAZOLE 500 MG PO TABS  Four tablets one time only 4 tablet 0  . NAPROXEN 500 MG PO TABS  Take one tablet by mouth two times a day . Discontinue IbuprofenTake 500 mg by mouth. Take one tablet by mouth two times a day . Discontinue Ibuprofen 60 tablet 4  . OMEPRAZOLE 20 MG PO TBEC Oral Take by mouth daily.      Marland Kitchen TEMAZEPAM 15 MG PO CAPS Oral  Take 1 capsule (15 mg total) by mouth at bedtime as needed. 30 capsule 4  . TRAMADOL HCL 50 MG PO TABS Oral Take 1 tablet (50 mg total) by mouth once as needed. 30 tablet 4    BP 148/86  Pulse 71  Temp(Src) 97.7 F (36.5 C) (Oral)  Ht 5\' 4"  (1.626 m)  Wt 126 lb (57.153 kg)  BMI 21.63 kg/m2  SpO2 100%  Physical Exam  Nursing note and vitals reviewed. Constitutional: She is oriented to person, place, and time. She appears well-developed and well-nourished. No distress.  HENT:  Head: Normocephalic and atraumatic.  Cardiovascular: Normal rate and regular rhythm.   No murmur heard. Pulmonary/Chest: Effort normal and breath sounds normal. No respiratory distress.  Abdominal: Soft. She  exhibits no distension. There is tenderness in the suprapubic area. There is no rebound and no CVA tenderness.  Neurological: She is alert and oriented to person, place, and time. She exhibits normal muscle tone. Coordination normal.  Skin: Skin is warm and dry.    ED Course  Procedures (including critical care time)  Labs Reviewed  URINALYSIS, ROUTINE W REFLEX MICROSCOPIC - Abnormal; Notable for the following:    APPearance HAZY (*)    Specific Gravity, Urine >1.030 (*)    Hgb urine dipstick TRACE (*)    Bilirubin Urine SMALL (*)    All other components within normal limits  URINE MICROSCOPIC-ADD ON - Abnormal; Notable for the following:    Squamous Epithelial / LPF FEW (*)    Bacteria, UA FEW (*)    Crystals CA OXALATE CRYSTALS (*)    All other components within normal limits  URINE CULTURE     Urine culture is pending   MDM    Urine culture from 06/03/11 grew e. Coli with resistance to sulfa and intermediate to macrobid, so I will treat with cipro   TREATMENT IN THE ED: po cipro and pyridium    PRESCRIPTIONS GIVEN AT DISCHARGE:  cipro   Patient is alert, NAD.  Vitals are stable.  No fever, CVA tenderness or vomiting to suggest pyelonephritis.  Abd is soft, NT.  Urine culture is pending    Patient / Family / Caregiver understand and agree with initial ED impression and plan with expectations set for ED visit. Pt stable in ED with no significant deterioration in condition. Pt feels improved after observation and/or treatment in ED.           Andrienne Havener L. [place], [place] 08/19/11 1639

## 2011-08-14 NOTE — Discharge Instructions (Signed)

## 2011-08-14 NOTE — ED Notes (Signed)
Pt c/o pain in her vagina since having a pap smear at Dr. Anthony Sar office on Thursday. Denies discharge. C/o burning and pain with urination.

## 2011-08-15 NOTE — ED Provider Notes (Signed)
Medical screening examination/treatment/procedure(s) were performed by non-physician practitioner and as supervising physician I was immediately available for consultation/collaboration.   Shelda Jakes, MD 08/15/11 [phone]

## 2011-08-16 LAB — URINE CULTURE: Colony Count: 2000

## 2011-08-20 ENCOUNTER — Telehealth: Payer: Self-pay | Admitting: Family Medicine

## 2011-08-20 NOTE — Telephone Encounter (Signed)
Is referring to diflucan

## 2011-08-22 NOTE — Telephone Encounter (Signed)
Refill fluconazole x 1 please and let her know, if none on file, then erx fluconazole 150mg  #1 tablet, no refill

## 2011-08-23 MED ORDER — FLUCONAZOLE 150 MG PO TABS: 150.0000 mg | ORAL_TABLET | Freq: Once | ORAL | Status: AC

## 2011-08-23 NOTE — Telephone Encounter (Signed)
Med sent in as patient requested

## 2011-09-16 ENCOUNTER — Ambulatory Visit: Payer: Medicaid Other | Admitting: Family Medicine

## 2011-09-17 ENCOUNTER — Encounter: Payer: Self-pay | Admitting: Family Medicine

## 2011-10-22 ENCOUNTER — Telehealth: Payer: Self-pay | Admitting: Family Medicine

## 2011-10-22 NOTE — Telephone Encounter (Signed)
Would you be willing to provide this information?

## 2011-10-25 NOTE — Telephone Encounter (Signed)
Pt aware.

## 2011-10-25 NOTE — Telephone Encounter (Signed)
Not willing to do this at this time, please let her know

## 2011-10-26 ENCOUNTER — Ambulatory Visit (INDEPENDENT_AMBULATORY_CARE_PROVIDER_SITE_OTHER): Payer: Medicaid Other | Admitting: Family Medicine

## 2011-10-26 ENCOUNTER — Encounter: Payer: Self-pay | Admitting: Family Medicine

## 2011-10-26 VITALS — BP 120/80 | HR 81 | Resp 18 | Ht 64.5 in | Wt 124.0 lb

## 2011-10-26 DIAGNOSIS — G4700 Insomnia, unspecified: Secondary | ICD-10-CM

## 2011-10-26 DIAGNOSIS — Z1211 Encounter for screening for malignant neoplasm of colon: Secondary | ICD-10-CM

## 2011-10-26 DIAGNOSIS — B1710 Acute hepatitis C without hepatic coma: Secondary | ICD-10-CM

## 2011-10-26 DIAGNOSIS — J45909 Unspecified asthma, uncomplicated: Secondary | ICD-10-CM

## 2011-10-26 DIAGNOSIS — M542 Cervicalgia: Secondary | ICD-10-CM

## 2011-10-26 DIAGNOSIS — F17200 Nicotine dependence, unspecified, uncomplicated: Secondary | ICD-10-CM

## 2011-10-26 NOTE — Patient Instructions (Addendum)
F/U in 4.5 month  you wil not be getting a letter from me stating you are capable of handling your money.  I STRONGLY recommend that you get a Child psychotherapist to help you with this instead of asking  People to help you, then you say  They are not doing as you ask.  You need to see the liver  specialist and get a colonoscopy, we will see if we can get an appointment date

## 2011-11-15 NOTE — Progress Notes (Signed)
  Subjective:    Patient ID: Yvonne Clay, female    DOB: [birth year], 56 y.o.   MRN: [id number]  HPI The PT is here for follow up and re-evaluation of chronic medical conditions and  medication management  Preventive health is updated, specifically  Cancer screening and Immunization.  Her colonoscopy is still outstanding, and she has still not been to be evaluated about  Hepatitis. She is here requesting that she get get a letter  Stating that she is competent with regard to balancing her checks, I advise her that she is certainly  Not competent at doing this       Review of Systems See HPI Denies recent fever or chills. Denies sinus pressure, nasal congestion, ear pain or sore throat. Denies chest congestion, productive cough or wheezing. Denies chest pains, palpitations and leg swelling Denies abdominal pain, nausea, vomiting,diarrhea or constipation.   Denies dysuria, frequency, hesitancy or incontinence.  Denies headaches, seizures, numbness, or tingling. Denies uncontrolled  depression, anxiety or insomnia. Denies skin break down or rash.        Objective:   Physical Exam Patient alert and oriented and in no cardiopulmonary distress.  HEENT: No facial asymmetry, EOMI, no sinus tenderness,  oropharynx pink and moist.  Neck decreased ROM,  no adenopathy.  Chest: Clear to auscultation bilaterally.  CVS: S1, S2 no murmurs, no S3.  ABD: Soft non tender. Bowel sounds normal.  Ext: No edema  MS: Adequate ROM spine, shoulders, hips and knees.  Skin: Intact, no ulcerations or rash noted.  Psych: Good eye contact, normal affect. Memory impaired not anxious or depressed appearing.  CNS: CN 2-12 intact, power, tone and sensation normal throughout.        Assessment & Plan:

## 2011-11-15 NOTE — Assessment & Plan Note (Signed)
onngoing use of anti inflammatory an muscle relaxant , as needed

## 2011-11-15 NOTE — Assessment & Plan Note (Signed)
Unchanged, cessation counseling done , no commitment to quitting at this time 

## 2011-11-15 NOTE — Assessment & Plan Note (Signed)
Sleep hygiene discussed and she is to continue medication

## 2011-11-15 NOTE — Assessment & Plan Note (Signed)
Has been referred in the past, non compliant , importance of further eval by specialist stressed, will refer again, she also needs a colonoscopy

## 2011-11-15 NOTE — Assessment & Plan Note (Signed)
Stable, infrequent episodes , proventil as needed only

## 2011-12-09 ENCOUNTER — Encounter (INDEPENDENT_AMBULATORY_CARE_PROVIDER_SITE_OTHER): Payer: Self-pay | Admitting: *Deleted

## 2011-12-23 ENCOUNTER — Ambulatory Visit (INDEPENDENT_AMBULATORY_CARE_PROVIDER_SITE_OTHER): Payer: Medicaid Other | Admitting: Internal Medicine

## 2012-01-25 ENCOUNTER — Ambulatory Visit: Payer: Medicaid Other | Admitting: Family Medicine

## 2012-02-16 IMAGING — CR DG CHEST 2V
2 series · 2 of 2 positions shown · non-contrast
Comparison: 04/12/2011

CLINICAL DATA: Bronchitis.  COPD and emphysema.

CHEST - 2 VIEW

[view not recorded (1 of 2)]
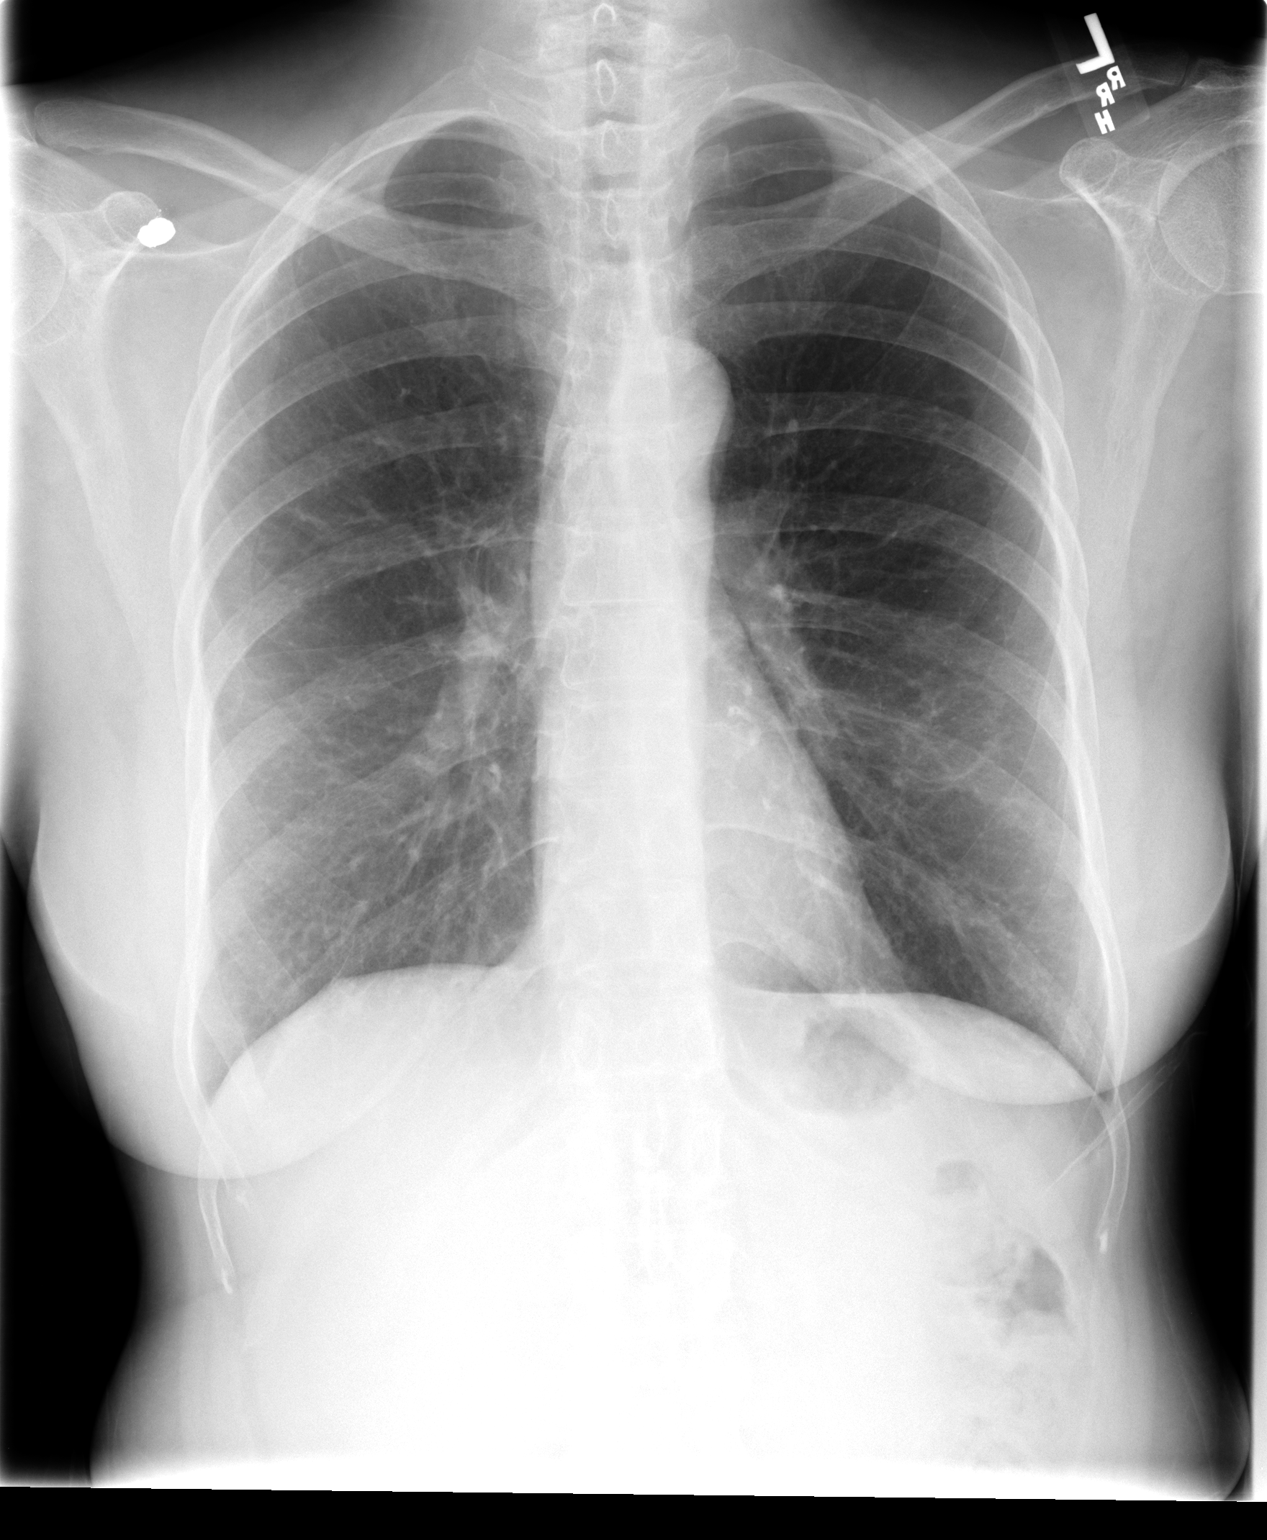

[view not recorded (2 of 2)]
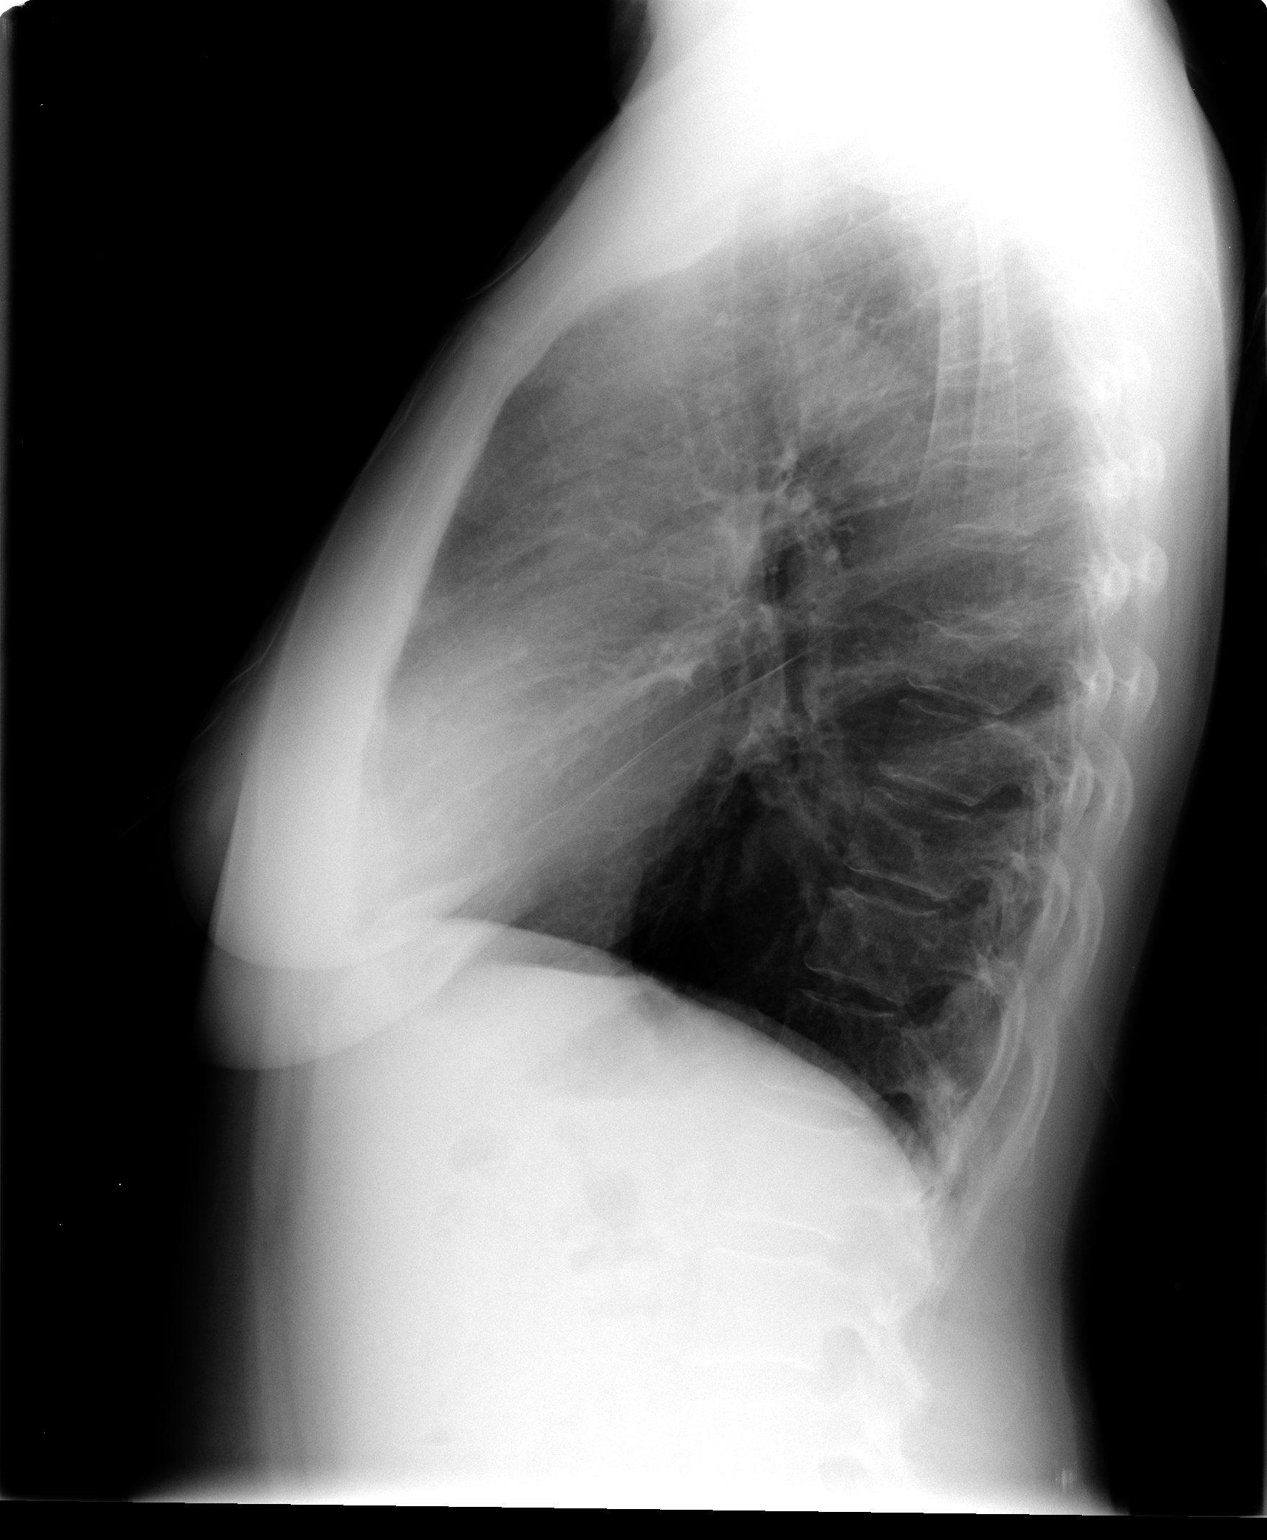

[2 of 2 positions shown; findings below may reference images not displayed]

FINDINGS: Heart size appears normal.

No pleural effusion or pulmonary edema.

No airspace consolidation.  Increased lung volumes and coarsened
interstitial markings are noted bilaterally compatible with COPD.

No airspace consolidation identified.
IMPRESSION: 1.  Stable COPD without superimposed acute cardiopulmonary
abnormalities.

## 2012-02-28 ENCOUNTER — Telehealth: Payer: Self-pay | Admitting: Family Medicine

## 2012-02-28 ENCOUNTER — Other Ambulatory Visit: Payer: Self-pay | Admitting: Family Medicine

## 2012-02-28 DIAGNOSIS — Z139 Encounter for screening, unspecified: Secondary | ICD-10-CM

## 2012-02-28 MED ORDER — ALBUTEROL SULFATE HFA 108 (90 BASE) MCG/ACT IN AERS: INHALATION_SPRAY | RESPIRATORY_TRACT | Status: DC

## 2012-02-28 NOTE — Telephone Encounter (Signed)
Med sent in as requested  

## 2012-04-03 ENCOUNTER — Ambulatory Visit (HOSPITAL_COMMUNITY): Payer: Medicaid Other

## 2012-04-05 ENCOUNTER — Ambulatory Visit: Payer: Medicaid Other | Admitting: Family Medicine

## 2012-04-05 ENCOUNTER — Encounter: Payer: Self-pay | Admitting: Family Medicine

## 2012-05-16 ENCOUNTER — Ambulatory Visit: Payer: Medicaid Other | Admitting: Family Medicine

## 2012-05-22 ENCOUNTER — Encounter: Payer: Self-pay | Admitting: Family Medicine

## 2012-05-22 ENCOUNTER — Ambulatory Visit (INDEPENDENT_AMBULATORY_CARE_PROVIDER_SITE_OTHER): Payer: Medicaid Other | Admitting: Family Medicine

## 2012-05-22 VITALS — BP 160/80 | HR 92 | Resp 18 | Ht 64.5 in | Wt 132.0 lb

## 2012-05-22 DIAGNOSIS — F17200 Nicotine dependence, unspecified, uncomplicated: Secondary | ICD-10-CM

## 2012-05-22 DIAGNOSIS — M542 Cervicalgia: Secondary | ICD-10-CM

## 2012-05-22 DIAGNOSIS — G4700 Insomnia, unspecified: Secondary | ICD-10-CM

## 2012-05-22 DIAGNOSIS — F329 Major depressive disorder, single episode, unspecified: Secondary | ICD-10-CM

## 2012-05-22 DIAGNOSIS — J45909 Unspecified asthma, uncomplicated: Secondary | ICD-10-CM

## 2012-05-22 DIAGNOSIS — R5383 Other fatigue: Secondary | ICD-10-CM

## 2012-05-22 DIAGNOSIS — F1020 Alcohol dependence, uncomplicated: Secondary | ICD-10-CM

## 2012-05-22 DIAGNOSIS — I10 Essential (primary) hypertension: Secondary | ICD-10-CM | POA: Insufficient documentation

## 2012-05-22 DIAGNOSIS — Z13220 Encounter for screening for lipoid disorders: Secondary | ICD-10-CM

## 2012-05-22 MED ORDER — AMLODIPINE BESYLATE 2.5 MG PO TABS: 2.5000 mg | ORAL_TABLET | Freq: Every day | ORAL | Status: DC

## 2012-05-22 NOTE — Patient Instructions (Addendum)
F/u in 4 month \ Blood pressure is high, new medication  Prescribed for this, amlodipine  You need to stop cigarettes and street drugs.  Please schedule your mammogram and keep appt, check on this at checkout  Colonoscopy past due need to reschedule this also  Fasting cbc, lipid, cmp, tSh, as soon as possible

## 2012-05-23 NOTE — Assessment & Plan Note (Signed)
Controlled, no change in medication  

## 2012-05-23 NOTE — Assessment & Plan Note (Signed)
Unchanged, continue meds 

## 2012-05-23 NOTE — Assessment & Plan Note (Signed)
Unchanged. Patient counseled for approximately 5 minutes regarding the health risks of ongoing nicotine use, specifically all types of cancer, heart disease, stroke and respiratory failure. The options available for help with cessation ,the behavioral changes to assist the process, and the option to either gradully reduce usage  Or abruptly stop.is also discussed. Pt is also encouraged to set specific goals in number of cigarettes used daily, as well as to set a quit date.  

## 2012-05-23 NOTE — Assessment & Plan Note (Signed)
Uncontrolled, start medication. DASH diet and commitment to daily physical activity for a minimum of 30 minutes discussed and encouraged, as a part of hypertension management. The importance of attaining a healthy weight is also discussed.  

## 2012-05-23 NOTE — Assessment & Plan Note (Signed)
Continue med, not suicidal or homicidal and denies cocaine use at this time

## 2012-05-23 NOTE — Assessment & Plan Note (Signed)
Sleep hygiene discussed, continue meds

## 2012-05-23 NOTE — Progress Notes (Signed)
  Subjective:    Patient ID: Yvonne Clay, female    DOB: 12/31/1955, 57 y.o.   MRN: [id number]  HPI The PT is here for follow up and re-evaluation of chronic medical conditions, medication management and review of any available recent lab and radiology data.  Preventive health is updated, specifically  Cancer screening and Immunization.    The PT denies any adverse reactions to current medications since the last visit. States she needs refills on all med There are no new concerns.  There are no specific complaints  States she has cut back on nicotine , alcohol and drug use      Review of Systems    See HPI Denies recent fever or chills. Denies sinus pressure, nasal congestion, ear pain or sore throat. Denies chest congestion, productive cough or wheezing. Denies chest pains, palpitations and leg swelling Denies abdominal pain, nausea, vomiting,diarrhea or constipation.   Denies dysuria, frequency, hesitancy or incontinence. Chronic neck pain unchanged Denies headaches, seizures, numbness, or tingling. Denies uncontrolled  depression, anxiety or insomnia. Denies skin break down or rash.     Objective:   Physical Exam  Patient alert and oriented and in no cardiopulmonary distress.  HEENT: No facial asymmetry, EOMI, no sinus tenderness,  oropharynx pink and moist.  Neck decreased ROM no adenopathy.  Chest: Clear to auscultation bilaterally.Decreased air entry throughout  CVS: S1, S2 no murmurs, no S3.  ABD: Soft non tender. Bowel sounds normal.  Ext: No edema  MS: Adequate ROM s, hips and knees.  Skin: Intact, no ulcerations or rash noted.  Psych: Good eye contact, normal affect.  not anxious or depressed appearing.  CNS: CN 2-12 intact, power, tone and sensation normal throughout.       Assessment & Plan:

## 2012-05-23 NOTE — Assessment & Plan Note (Signed)
Ongoing use counseled to quit

## 2012-05-28 ENCOUNTER — Emergency Department (HOSPITAL_COMMUNITY)
Admission: EM | Admit: 2012-05-28 | Discharge: 2012-05-28 | Disposition: A | Discharge status: Home / Self Care | Payer: Medicaid Other | Attending: Emergency Medicine | Admitting: Emergency Medicine

## 2012-05-28 ENCOUNTER — Encounter (HOSPITAL_COMMUNITY): Payer: Self-pay | Admitting: *Deleted

## 2012-05-28 DIAGNOSIS — J4489 Other specified chronic obstructive pulmonary disease: Secondary | ICD-10-CM | POA: Insufficient documentation

## 2012-05-28 DIAGNOSIS — Z8739 Personal history of other diseases of the musculoskeletal system and connective tissue: Secondary | ICD-10-CM | POA: Insufficient documentation

## 2012-05-28 DIAGNOSIS — Z79899 Other long term (current) drug therapy: Secondary | ICD-10-CM | POA: Insufficient documentation

## 2012-05-28 DIAGNOSIS — B182 Chronic viral hepatitis C: Secondary | ICD-10-CM | POA: Insufficient documentation

## 2012-05-28 DIAGNOSIS — J449 Chronic obstructive pulmonary disease, unspecified: Secondary | ICD-10-CM | POA: Insufficient documentation

## 2012-05-28 DIAGNOSIS — F10929 Alcohol use, unspecified with intoxication, unspecified: Secondary | ICD-10-CM

## 2012-05-28 DIAGNOSIS — R300 Dysuria: Secondary | ICD-10-CM | POA: Insufficient documentation

## 2012-05-28 DIAGNOSIS — Z8659 Personal history of other mental and behavioral disorders: Secondary | ICD-10-CM | POA: Insufficient documentation

## 2012-05-28 DIAGNOSIS — F17200 Nicotine dependence, unspecified, uncomplicated: Secondary | ICD-10-CM | POA: Insufficient documentation

## 2012-05-28 DIAGNOSIS — N390 Urinary tract infection, site not specified: Secondary | ICD-10-CM

## 2012-05-28 DIAGNOSIS — F1010 Alcohol abuse, uncomplicated: Secondary | ICD-10-CM | POA: Insufficient documentation

## 2012-05-28 LAB — URINALYSIS, ROUTINE W REFLEX MICROSCOPIC
Glucose, UA: NEGATIVE mg/dL
Ketones, ur: NEGATIVE mg/dL
Nitrite: NEGATIVE
Protein, ur: NEGATIVE mg/dL

## 2012-05-28 LAB — URINE MICROSCOPIC-ADD ON

## 2012-05-28 MED ORDER — CIPROFLOXACIN HCL 250 MG PO TABS
500.0000 mg | ORAL_TABLET | Freq: Once | ORAL | Status: AC
Start: 2012-05-28 — End: 2012-05-28
  Administered 2012-05-28: 500 mg via ORAL
  Filled 2012-05-28: qty 2

## 2012-05-28 MED ORDER — LIDOCAINE-EPINEPHRINE 2 %-1:100000 IJ SOLN: 20.0000 mL | Freq: Once | INTRAMUSCULAR | Status: DC

## 2012-05-28 MED ORDER — IBUPROFEN 400 MG PO TABS
400.0000 mg | ORAL_TABLET | Freq: Once | ORAL | Status: AC
  Administered 2012-05-28: 400 mg via ORAL
  Filled 2012-05-28: qty 1

## 2012-05-28 MED ORDER — PHENAZOPYRIDINE HCL 200 MG PO TABS: 200.0000 mg | ORAL_TABLET | Freq: Three times a day (TID) | ORAL | Status: DC

## 2012-05-28 MED ORDER — LIDOCAINE-EPINEPHRINE (PF) 2 %-1:200000 IJ SOLN
INTRAMUSCULAR | Status: AC
  Filled 2012-05-28: qty 20

## 2012-05-28 MED ORDER — CIPROFLOXACIN HCL 500 MG PO TABS: 500.0000 mg | ORAL_TABLET | Freq: Two times a day (BID) | ORAL | Status: DC

## 2012-05-28 MED ORDER — PHENAZOPYRIDINE HCL 100 MG PO TABS
200.0000 mg | ORAL_TABLET | Freq: Once | ORAL | Status: AC
  Administered 2012-05-28: 200 mg via ORAL
  Filled 2012-05-28: qty 2

## 2012-05-28 NOTE — ED Notes (Addendum)
Pt states she has a "knot with a hole in it" in her vagina x 2 days. Pt c/o pain when water or urine hits it.

## 2012-05-28 NOTE — ED Provider Notes (Signed)
History     CSN: [id number]  Arrival date & time 05/28/12  0255   First MD Initiated Contact with Patient 05/28/12 [phone]      Chief Complaint  Patient presents with  . Vaginal Pain    (Consider location/radiation/quality/duration/timing/severity/associated sxs/prior treatment) HPIDebhroa A Clay is a 57 y.o. female who presents intoxicated to the emergency department complaining that she has "a knot with a hole in it" in her vagina for the last 2 days. She says it hurts when she urinates or when she touches it or water hits it and shower. She complains of dysuria, denies any fevers, chills, abdominal pain or back pain. No chest pain or shortness of breath. She's had similar symptoms in the past. No vaginal discharge.   Past Medical History  Diagnosis Date  . Substance abuse   . Depression   . COPD (chronic obstructive pulmonary disease)   . Arthritis   . Hepatitis C dx in 2011 by dentist    Past Surgical History  Procedure Date  . Abdominal hysterectomy     History reviewed. No pertinent family history.  History  Substance Use Topics  . Smoking status: Current Every Day Smoker -- 0.3 packs/day  . Smokeless tobacco: Not on file  . Alcohol Use: 0.0 oz/week     Comment: patient states she drinks about two forty oz drinks a week    OB History    Grav Para Term Preterm Abortions TAB SAB Ect Mult Living                  Review of Systems At least 10pt or greater review of systems completed and are negative except where specified in the HPI.  Allergies  Penicillins  Home Medications   Current Outpatient Rx  Name  Route  Sig  Dispense  Refill  . ALBUTEROL SULFATE HFA 108 (90 BASE) MCG/ACT IN AERS      Every 6-8 hours as needed   6.7 g   4   . AMLODIPINE BESYLATE 2.5 MG PO TABS   Oral   Take 1 tablet (2.5 mg total) by mouth daily.   30 tablet   11   . CIPROFLOXACIN HCL 500 MG PO TABS   Oral   Take 1 tablet (500 mg total) by mouth every 12 (twelve)  hours.   10 tablet   0   . CYCLOBENZAPRINE HCL 10 MG PO TABS   Oral   Take 1 tablet (10 mg total) by mouth at bedtime as needed for muscle spasms. Take one tablet by mouth at bedtime as needed   30 tablet   4   . FLUOXETINE HCL 10 MG PO CAPS   Oral   Take 1 capsule (10 mg total) by mouth daily.   30 capsule   4   . PHENAZOPYRIDINE HCL 200 MG PO TABS   Oral   Take 1 tablet (200 mg total) by mouth 3 (three) times daily.   6 tablet   0   . TEMAZEPAM 15 MG PO CAPS   Oral   Take 1 capsule (15 mg total) by mouth at bedtime as needed.   30 capsule   4     BP 149/97  Pulse 96  Temp 98 F (36.7 C) (Oral)  Resp 20  Ht 5' 4.5" (1.638 m)  Wt 132 lb (59.875 kg)  BMI 22.31 kg/m2  SpO2 98%  Physical Exam  Nursing notes reviewed.  Electronic medical record reviewed. VITAL SIGNS:  Filed Vitals:   05/28/12 0314  BP: 149/97  Pulse: 96  Temp: 98 F (36.7 C)  TempSrc: Oral  Resp: 20  Height: 5' 4.5" (1.638 m)  Weight: 132 lb (59.875 kg)  SpO2: 98%   CONSTITUTIONAL: Awake, oriented, appears non-toxic HENT: Atraumatic, normocephalic, oral mucosa pink and moist, airway patent. Nares patent without drainage. External ears normal. EYES: Conjunctiva clear, EOMI, PERRLA NECK: Trachea midline, non-tender, supple CARDIOVASCULAR: Normal heart rate, Normal rhythm, No murmurs, rubs, gallops PULMONARY/CHEST: Clear to auscultation, no rhonchi, wheezes, or rales. Symmetrical breath sounds. Non-tender. ABDOMINAL: Non-distended, soft, non-tender - no rebound or guarding.  BS normal. NEUROLOGIC: Non-focal, moving all four extremities, no gross sensory or motor deficits. EXTREMITIES: No clubbing, cyanosis, or edema SKIN: Warm, Dry, No erythema, No rash GU exam performed with female tech in attendance: External labia appear normal. Urethral meatus appears mildly inflamed and is tender to palpation. She says this is what hurts and this is the "knot" she is talking about ED Course    Procedures (including critical care time)  Labs Reviewed  URINALYSIS, ROUTINE W REFLEX MICROSCOPIC - Abnormal; Notable for the following:    Leukocytes, UA SMALL (*)     All other components within normal limits  URINE MICROSCOPIC-ADD ON - Abnormal; Notable for the following:    Squamous Epithelial / LPF FEW (*)     Bacteria, UA FEW (*)     All other components within normal limits  URINE CULTURE   No results found.   1. UTI (urinary tract infection)   2. Alcohol intoxication   3. Dysuria       MDM  Yvonne Clay Clay is a 57 y.o. female presents the emergency department complaining about some urethral pain after a urethral meatus. Patient makes an earlier request for pain medicine, given her some pyridium for possible UTI.  UA is suggestive of possible urinary tract infection, with dysuria and symptoms we'll treat. Culture urine. Patient is given a prescription for ciprofloxacin which prior urine cultures shown susceptibility to (intermediate to Macrobid.)   Patient is discharged home stable and in good condition. Do not think the patient has any other intra-abdominal emergency cyst this time I do not think any further laboratory testing or imaging is indicated.  I explained the diagnosis and have given explicit precautions to return to the ER including any other new or worsening symptoms. The patient understands and accepts the medical plan as it's been dictated and I have answered their questions. Discharge instructions concerning home care and prescriptions have been given.  The patient is STABLE and is discharged to home in good condition.         Jones Skene, MD 05/28/12 [phone]

## 2012-05-29 ENCOUNTER — Telehealth: Payer: Self-pay | Admitting: Family Medicine

## 2012-05-29 ENCOUNTER — Other Ambulatory Visit: Payer: Self-pay | Admitting: Family Medicine

## 2012-05-29 LAB — URINE CULTURE: Colony Count: NO GROWTH

## 2012-05-29 MED ORDER — CIPROFLOXACIN HCL 500 MG PO TABS: 500.0000 mg | ORAL_TABLET | Freq: Two times a day (BID) | ORAL | Status: AC

## 2012-05-29 NOTE — Telephone Encounter (Signed)
Based on abn UA at the Ed yesterday, cipro x 5 days sent to her pharmacy, she is aware

## 2012-05-29 NOTE — Telephone Encounter (Signed)
Since she was just here can we do nurse visit [facility]?

## 2012-06-06 ENCOUNTER — Inpatient Hospital Stay (HOSPITAL_COMMUNITY): Admission: RE | Admit: 2012-06-06 | Payer: Medicaid Other | Source: Ambulatory Visit

## 2012-06-28 ENCOUNTER — Telehealth: Payer: Self-pay | Admitting: Family Medicine

## 2012-06-28 LAB — COMPREHENSIVE METABOLIC PANEL
Albumin: 4 g/dL (ref 3.5–5.2)
CO2: 27 mEq/L (ref 19–32)
Chloride: 105 mEq/L (ref 96–112)
Glucose, Bld: 98 mg/dL (ref 70–99)
Potassium: 4.6 mEq/L (ref 3.5–5.3)
Sodium: 139 mEq/L (ref 135–145)
Total Protein: 7.4 g/dL (ref 6.0–8.3)

## 2012-06-28 LAB — TSH: TSH: 3.221 u[IU]/mL (ref 0.350–4.500)

## 2012-06-28 LAB — CBC
Hemoglobin: 13.1 g/dL (ref 12.0–15.0)
RBC: 4.03 MIL/uL (ref 3.87–5.11)

## 2012-06-28 LAB — LIPID PANEL
LDL Cholesterol: 34 mg/dL (ref 0–99)
VLDL: 74 mg/dL — ABNORMAL HIGH (ref 0–40)

## 2012-06-28 NOTE — Telephone Encounter (Signed)
pls let pt  know that based  On her medical condition,Ishe does not qualify for assistance with personal care services, I have put the request back up front also

## 2012-06-28 NOTE — Telephone Encounter (Signed)
Front desk taking care of this

## 2012-06-30 ENCOUNTER — Other Ambulatory Visit: Payer: Self-pay

## 2012-06-30 DIAGNOSIS — I10 Essential (primary) hypertension: Secondary | ICD-10-CM

## 2012-06-30 DIAGNOSIS — M62838 Other muscle spasm: Secondary | ICD-10-CM

## 2012-06-30 MED ORDER — TEMAZEPAM 15 MG PO CAPS: 15.0000 mg | ORAL_CAPSULE | Freq: Every evening | ORAL | Status: DC | PRN

## 2012-06-30 MED ORDER — CYCLOBENZAPRINE HCL 10 MG PO TABS: 10.0000 mg | ORAL_TABLET | Freq: Every evening | ORAL | Status: DC | PRN

## 2012-06-30 MED ORDER — ALBUTEROL SULFATE HFA 108 (90 BASE) MCG/ACT IN AERS: INHALATION_SPRAY | RESPIRATORY_TRACT | Status: DC

## 2012-06-30 MED ORDER — AMLODIPINE BESYLATE 2.5 MG PO TABS: 2.5000 mg | ORAL_TABLET | Freq: Every day | ORAL | Status: DC

## 2012-06-30 MED ORDER — FLUOXETINE HCL 10 MG PO CAPS: 10.0000 mg | ORAL_CAPSULE | Freq: Every day | ORAL | Status: DC

## 2012-07-18 ENCOUNTER — Ambulatory Visit (HOSPITAL_COMMUNITY): Payer: Medicaid Other

## 2012-10-07 ENCOUNTER — Emergency Department (HOSPITAL_COMMUNITY)
Admission: EM | Admit: 2012-10-07 | Discharge: 2012-10-07 | Disposition: A | Discharge status: Home / Self Care | Payer: Medicaid Other | Attending: Emergency Medicine | Admitting: Emergency Medicine

## 2012-10-07 ENCOUNTER — Encounter (HOSPITAL_COMMUNITY): Payer: Self-pay

## 2012-10-07 DIAGNOSIS — Z880 Allergy status to penicillin: Secondary | ICD-10-CM | POA: Insufficient documentation

## 2012-10-07 DIAGNOSIS — Z8739 Personal history of other diseases of the musculoskeletal system and connective tissue: Secondary | ICD-10-CM | POA: Insufficient documentation

## 2012-10-07 DIAGNOSIS — F17200 Nicotine dependence, unspecified, uncomplicated: Secondary | ICD-10-CM | POA: Insufficient documentation

## 2012-10-07 DIAGNOSIS — F329 Major depressive disorder, single episode, unspecified: Secondary | ICD-10-CM | POA: Insufficient documentation

## 2012-10-07 DIAGNOSIS — J449 Chronic obstructive pulmonary disease, unspecified: Secondary | ICD-10-CM | POA: Insufficient documentation

## 2012-10-07 DIAGNOSIS — I10 Essential (primary) hypertension: Secondary | ICD-10-CM | POA: Insufficient documentation

## 2012-10-07 DIAGNOSIS — R590 Localized enlarged lymph nodes: Secondary | ICD-10-CM

## 2012-10-07 DIAGNOSIS — J4489 Other specified chronic obstructive pulmonary disease: Secondary | ICD-10-CM | POA: Insufficient documentation

## 2012-10-07 DIAGNOSIS — Z8619 Personal history of other infectious and parasitic diseases: Secondary | ICD-10-CM | POA: Insufficient documentation

## 2012-10-07 DIAGNOSIS — F3289 Other specified depressive episodes: Secondary | ICD-10-CM | POA: Insufficient documentation

## 2012-10-07 DIAGNOSIS — R599 Enlarged lymph nodes, unspecified: Secondary | ICD-10-CM | POA: Insufficient documentation

## 2012-10-07 HISTORY — DX: Essential (primary) hypertension: I10

## 2012-10-07 MED ORDER — CEPHALEXIN 500 MG PO CAPS: 500.0000 mg | ORAL_CAPSULE | Freq: Four times a day (QID) | ORAL | Status: DC

## 2012-10-07 NOTE — ED Provider Notes (Signed)
History     CSN: [id number]  Arrival date & time 10/07/12  0308   First MD Initiated Contact with Patient 10/07/12 0319      Chief Complaint  Patient presents with  . Sore Throat    (Consider location/radiation/quality/duration/timing/severity/associated sxs/prior treatment) HPI Comments: Patient with one week history of swollen, painful lymph node to the right side of the neck.  No fever or chills.  No sore throat.  She feels as though this is clogging up her ear.  Patient is a 57 y.o. female presenting with pharyngitis. The history is provided by the patient.  Sore Throat This is a new problem. Episode onset: one week ago. The problem occurs constantly. The problem has been gradually worsening. Pertinent negatives include no chest pain and no shortness of breath. The symptoms are aggravated by swallowing. Nothing relieves the symptoms. She has tried nothing for the symptoms.    Past Medical History  Diagnosis Date  . Substance abuse   . Depression   . COPD (chronic obstructive pulmonary disease)   . Arthritis   . Hepatitis C dx in 2011 by dentist  . Hypertension     Past Surgical History  Procedure Laterality Date  . Abdominal hysterectomy      No family history on file.  History  Substance Use Topics  . Smoking status: Current Every Day Smoker -- 0.30 packs/day  . Smokeless tobacco: Not on file  . Alcohol Use: 0.0 oz/week     Comment: patient states she drinks about two forty oz drinks a week    OB History   Grav Para Term Preterm Abortions TAB SAB Ect Mult Living                  Review of Systems  Respiratory: Negative for shortness of breath.   Cardiovascular: Negative for chest pain.  All other systems reviewed and are negative.    Allergies  Penicillins  Home Medications   Current Outpatient Rx  Name  Route  Sig  Dispense  Refill  . albuterol (PROVENTIL HFA) 108 (90 BASE) MCG/ACT inhaler      Every 6-8 hours as needed   6.7 g   0   .  amLODipine (NORVASC) 2.5 MG tablet   Oral   Take 1 tablet (2.5 mg total) by mouth daily.   30 tablet   11   . cyclobenzaprine (FLEXERIL) 10 MG tablet   Oral   Take 1 tablet (10 mg total) by mouth at bedtime as needed for muscle spasms. Take one tablet by mouth at bedtime as needed   30 tablet   4   . FLUoxetine (PROZAC) 10 MG capsule   Oral   Take 1 capsule (10 mg total) by mouth daily.   30 capsule   4   . temazepam (RESTORIL) 15 MG capsule   Oral   Take 1 capsule (15 mg total) by mouth at bedtime as needed.   30 capsule   4   . phenazopyridine (PYRIDIUM) 200 MG tablet   Oral   Take 1 tablet (200 mg total) by mouth 3 (three) times daily.   6 tablet   0     BP 147/90  Pulse 101  Temp(Src) 97.8 F (36.6 C) (Oral)  Resp 16  Ht 5' 4.5" (1.638 m)  Wt 125 lb (56.7 kg)  BMI 21.13 kg/m2  SpO2 97%  Physical Exam  Nursing note and vitals reviewed. Constitutional: She is oriented to person, place, and time.  She appears well-developed and well-nourished. No distress.  HENT:  Head: Normocephalic and atraumatic.  Mouth/Throat: Oropharynx is clear and moist.  TM's clear bilaterally.  Neck: Normal range of motion. Neck supple.  There is a reactive cervical lymph node on the right.    Cardiovascular: Normal rate, regular rhythm and normal heart sounds.   No murmur heard. Pulmonary/Chest: Effort normal and breath sounds normal. No respiratory distress.  Musculoskeletal: Normal range of motion. She exhibits no edema.  Lymphadenopathy:    She has cervical adenopathy.  Neurological: She is alert and oriented to person, place, and time.  Skin: Skin is warm. She is not diaphoretic.    ED Course  Procedures (including critical care time)  Labs Reviewed - No data to display No results found.   No diagnosis found.    MDM  This appears to be a reactive cervical lymph node.  Will treat with nsaids, keflex, return or follow up prn.        Geoffery Lyons,  MD 10/07/12 [phone]

## 2012-10-07 NOTE — ED Notes (Signed)
Pt states it hurts to swallow, also c/o pain to right side of neck (lymph node swollen)

## 2012-11-01 ENCOUNTER — Ambulatory Visit: Payer: Medicaid Other | Admitting: Family Medicine

## 2012-11-20 ENCOUNTER — Ambulatory Visit (INDEPENDENT_AMBULATORY_CARE_PROVIDER_SITE_OTHER): Payer: Medicaid Other | Admitting: Family Medicine

## 2012-11-20 VITALS — BP 118/80 | HR 92 | Resp 16 | Wt 120.8 lb

## 2012-11-20 DIAGNOSIS — J40 Bronchitis, not specified as acute or chronic: Secondary | ICD-10-CM

## 2012-11-20 DIAGNOSIS — J45909 Unspecified asthma, uncomplicated: Secondary | ICD-10-CM

## 2012-11-20 DIAGNOSIS — J984 Other disorders of lung: Secondary | ICD-10-CM

## 2012-11-20 DIAGNOSIS — R911 Solitary pulmonary nodule: Secondary | ICD-10-CM

## 2012-11-20 DIAGNOSIS — J449 Chronic obstructive pulmonary disease, unspecified: Secondary | ICD-10-CM

## 2012-11-20 DIAGNOSIS — F1420 Cocaine dependence, uncomplicated: Secondary | ICD-10-CM

## 2012-11-20 DIAGNOSIS — F17200 Nicotine dependence, unspecified, uncomplicated: Secondary | ICD-10-CM

## 2012-11-20 DIAGNOSIS — G4700 Insomnia, unspecified: Secondary | ICD-10-CM

## 2012-11-20 DIAGNOSIS — F329 Major depressive disorder, single episode, unspecified: Secondary | ICD-10-CM

## 2012-11-20 DIAGNOSIS — I10 Essential (primary) hypertension: Secondary | ICD-10-CM

## 2012-11-20 MED ORDER — SULFAMETHOXAZOLE-TMP DS 800-160 MG PO TABS: 1.0000 | ORAL_TABLET | Freq: Two times a day (BID) | ORAL | Status: DC

## 2012-11-20 MED ORDER — BENZONATATE 100 MG PO CAPS: 100.0000 mg | ORAL_CAPSULE | Freq: Three times a day (TID) | ORAL | Status: AC | PRN

## 2012-11-20 MED ORDER — ASPIRIN EC 81 MG PO TBEC: 81.0000 mg | DELAYED_RELEASE_TABLET | Freq: Every day | ORAL | Status: AC

## 2012-11-26 ENCOUNTER — Encounter: Payer: Self-pay | Admitting: Family Medicine

## 2012-11-26 NOTE — Assessment & Plan Note (Signed)
Worsening due to ongoing nicotine use Patient counseled for approximately 5 minutes regarding the health risks of ongoing nicotine use, specifically all types of cancer, heart disease, stroke and respiratory failure. The options available for help with cessation ,the behavioral changes to assist the process, and the option to either gradully reduce usage  Or abruptly stop.is also discussed. Pt is also encouraged to set specific goals in number of cigarettes used daily, as well as to set a quit date.  

## 2012-11-26 NOTE — Progress Notes (Signed)
  Subjective:    Patient ID: Yvonne Clay, female    DOB: Nov 17, 1955, 57 y.o.   MRN: [id number]  HPI The PT is here for follow up and re-evaluation of chronic medical conditions, medication management and review of any available recent lab and radiology data.  Preventive health is updated, specifically  Cancer screening and Immunization. Still refusing colonoscopy   The PT denies any adverse reactions to current medications since the last visit.  5 day h/o increased chest congestion and cough productive of thick green sputum, intermittent chills, no fever. Denies URI symptoms      Review of Systems See HPI  Denies sinus pressure, nasal congestion, ear pain or sore throat.  Denies chest pains, palpitations and leg swelling Denies abdominal pain, nausea, vomiting,diarrhea or constipation.   Denies dysuria, frequency, hesitancy or incontinence. Denies joint pain, swelling and limitation in mobility. Denies headaches, seizures, numbness, or tingling. Denies uncontrolled  depression, anxiety or insomnia. Denies skin break down or rash.        Objective:   Physical Exam  Patient alert and oriented and in no cardiopulmonary distress.Appears older than stated and undernourished  HEENT: No facial asymmetry, EOMI, no sinus tenderness,  oropharynx pink and moist.  Neck supple no adenopathy.Poor dentition  Chest: decreased air entry, scattered crackles, no wheezes  CVS: S1, S2 no murmurs, no S3.  ABD: Soft non tender. Bowel sounds normal.  Ext: No edema  MS: Adequate ROM spine, shoulders, hips and knees.  Skin: Intact, no ulcerations or rash noted.  Psych: Good eye contact, normal affect.   CNS: CN 2-12 intact, power, tone and sensation normal throughout.       Assessment & Plan:

## 2012-11-26 NOTE — Assessment & Plan Note (Signed)
Controlled, no change in medication  

## 2012-11-26 NOTE — Assessment & Plan Note (Signed)
No recent flares, controlled with rescue inhaler only

## 2012-11-26 NOTE — Assessment & Plan Note (Signed)
Controlled, takes medication on "as needed" bases, still self medication with street drugs, no intent to stop, counselled

## 2012-11-26 NOTE — Assessment & Plan Note (Signed)
Sleep hygiene reviewed, continue med as before 

## 2012-11-26 NOTE — Assessment & Plan Note (Signed)
Ongoing use, no commitment to stopping, counseled re health risks and fact that use has diminished the quality of her life

## 2012-11-26 NOTE — Assessment & Plan Note (Signed)
Decongestant and antibiotic prescribed 

## 2012-11-26 NOTE — Patient Instructions (Addendum)
F/u in 4 month, call if you need me before  Antibiotic course sent in for bronchitis.  You need to stop smoking and street drug use  to improve your health

## 2012-11-26 NOTE — Assessment & Plan Note (Signed)
Unchnaged, no commitment to quitting Patient counseled for approximately 5 minutes regarding the health risks of ongoing nicotine use, specifically all types of cancer, heart disease, stroke and respiratory failure. The options available for help with cessation ,the behavioral changes to assist the process, and the option to either gradully reduce usage  Or abruptly stop.is also discussed. Pt is also encouraged to set specific goals in number of cigarettes used daily, as well as to set a quit date.

## 2013-03-22 ENCOUNTER — Telehealth: Payer: Self-pay | Admitting: Family Medicine

## 2013-03-23 MED ORDER — ALBUTEROL SULFATE HFA 108 (90 BASE) MCG/ACT IN AERS: INHALATION_SPRAY | RESPIRATORY_TRACT | Status: DC

## 2013-03-23 NOTE — Telephone Encounter (Signed)
1 inhaler sent only until OV

## 2013-03-26 ENCOUNTER — Ambulatory Visit: Payer: Medicaid Other | Admitting: Family Medicine

## 2013-04-06 ENCOUNTER — Ambulatory Visit: Payer: Medicaid Other | Admitting: Family Medicine

## 2013-09-05 ENCOUNTER — Ambulatory Visit (HOSPITAL_COMMUNITY)
Admission: RE | Admit: 2013-09-05 | Discharge: 2013-09-05 | Disposition: A | Discharge status: Home / Self Care | Payer: Medicaid Other | Source: Ambulatory Visit | Attending: Family Medicine | Admitting: Family Medicine

## 2013-09-05 ENCOUNTER — Encounter (INDEPENDENT_AMBULATORY_CARE_PROVIDER_SITE_OTHER): Payer: Self-pay

## 2013-09-05 ENCOUNTER — Encounter: Payer: Self-pay | Admitting: Family Medicine

## 2013-09-05 ENCOUNTER — Ambulatory Visit (INDEPENDENT_AMBULATORY_CARE_PROVIDER_SITE_OTHER): Payer: Medicaid Other | Admitting: Family Medicine

## 2013-09-05 ENCOUNTER — Other Ambulatory Visit: Payer: Self-pay | Admitting: Family Medicine

## 2013-09-05 VITALS — BP 134/82 | HR 64 | Resp 16 | Ht 64.5 in | Wt 112.8 lb

## 2013-09-05 DIAGNOSIS — Z1239 Encounter for other screening for malignant neoplasm of breast: Secondary | ICD-10-CM

## 2013-09-05 DIAGNOSIS — F329 Major depressive disorder, single episode, unspecified: Secondary | ICD-10-CM

## 2013-09-05 DIAGNOSIS — I10 Essential (primary) hypertension: Secondary | ICD-10-CM

## 2013-09-05 DIAGNOSIS — J45909 Unspecified asthma, uncomplicated: Secondary | ICD-10-CM

## 2013-09-05 DIAGNOSIS — Z79899 Other long term (current) drug therapy: Secondary | ICD-10-CM

## 2013-09-05 DIAGNOSIS — M25519 Pain in unspecified shoulder: Secondary | ICD-10-CM

## 2013-09-05 DIAGNOSIS — F1420 Cocaine dependence, uncomplicated: Secondary | ICD-10-CM

## 2013-09-05 DIAGNOSIS — R5381 Other malaise: Secondary | ICD-10-CM

## 2013-09-05 DIAGNOSIS — R5383 Other fatigue: Secondary | ICD-10-CM

## 2013-09-05 DIAGNOSIS — F3289 Other specified depressive episodes: Secondary | ICD-10-CM

## 2013-09-05 DIAGNOSIS — N3090 Cystitis, unspecified without hematuria: Secondary | ICD-10-CM

## 2013-09-05 DIAGNOSIS — M25512 Pain in left shoulder: Secondary | ICD-10-CM

## 2013-09-05 LAB — COMPREHENSIVE METABOLIC PANEL
ALBUMIN: 4.1 g/dL (ref 3.5–5.2)
ALT: 76 U/L — AB (ref 0–35)
AST: 163 U/L — ABNORMAL HIGH (ref 0–37)
Alkaline Phosphatase: 113 U/L (ref 39–117)
BILIRUBIN TOTAL: 0.8 mg/dL (ref 0.2–1.2)
BUN: 8 mg/dL (ref 6–23)
CALCIUM: 9.6 mg/dL (ref 8.4–10.5)
CHLORIDE: 102 meq/L (ref 96–112)
CO2: 28 meq/L (ref 19–32)
Creat: 0.84 mg/dL (ref 0.50–1.10)
Glucose, Bld: 94 mg/dL (ref 70–99)
POTASSIUM: 4 meq/L (ref 3.5–5.3)
SODIUM: 142 meq/L (ref 135–145)
TOTAL PROTEIN: 7.8 g/dL (ref 6.0–8.3)

## 2013-09-05 LAB — CBC WITH DIFFERENTIAL/PLATELET
Basophils Absolute: 0 10*3/uL (ref 0.0–0.1)
Basophils Relative: 1 % (ref 0–1)
Eosinophils Absolute: 0.1 10*3/uL (ref 0.0–0.7)
Eosinophils Relative: 3 % (ref 0–5)
HCT: 36.6 % (ref 36.0–46.0)
HEMOGLOBIN: 12.8 g/dL (ref 12.0–15.0)
LYMPHS ABS: 2.1 10*3/uL (ref 0.7–4.0)
LYMPHS PCT: 58 % — AB (ref 12–46)
MCH: 33.2 pg (ref 26.0–34.0)
MCHC: 35 g/dL (ref 30.0–36.0)
MCV: 95.1 fL (ref 78.0–100.0)
MONOS PCT: 12 % (ref 3–12)
Monocytes Absolute: 0.4 10*3/uL (ref 0.1–1.0)
NEUTROS ABS: 1 10*3/uL — AB (ref 1.7–7.7)
NEUTROS PCT: 26 % — AB (ref 43–77)
PLATELETS: 213 10*3/uL (ref 150–400)
RBC: 3.85 MIL/uL — AB (ref 3.87–5.11)
RDW: 13 % (ref 11.5–15.5)
WBC: 3.7 10*3/uL — AB (ref 4.0–10.5)

## 2013-09-05 LAB — LIPID PANEL
CHOL/HDL RATIO: 1.8 ratio
Cholesterol: 166 mg/dL (ref 0–200)
HDL: 90 mg/dL (ref >39)
LDL Cholesterol: 65 mg/dL (ref 0–99)
Triglycerides: 55 mg/dL (ref <150)
VLDL: 11 mg/dL (ref 0–40)

## 2013-09-05 LAB — POCT URINALYSIS DIPSTICK
Blood, UA: NEGATIVE
GLUCOSE UA: NEGATIVE
Ketones, UA: NEGATIVE
Leukocytes, UA: NEGATIVE
NITRITE UA: NEGATIVE
Protein, UA: 30
Spec Grav, UA: >=1.03
UROBILINOGEN UA: 2
pH, UA: 6

## 2013-09-05 MED ORDER — TRAMADOL HCL 50 MG PO TABS: 50.0000 mg | ORAL_TABLET | Freq: Three times a day (TID) | ORAL | Status: DC | PRN

## 2013-09-05 MED ORDER — ALBUTEROL SULFATE HFA 108 (90 BASE) MCG/ACT IN AERS: INHALATION_SPRAY | RESPIRATORY_TRACT | Status: DC

## 2013-09-05 NOTE — Patient Instructions (Signed)
Annual CPE in November, call if you need me before   You need to to stop alcohol and street drug use  Xray of left shoulder  Today, and medication is sent in  Keep appt for mammogram that is scheduled  CBC, cmp, TSH lipid  today

## 2013-09-06 LAB — HIV ANTIBODY (ROUTINE TESTING W REFLEX): HIV 1&2 Ab, 4th Generation: NONREACTIVE

## 2013-09-06 LAB — TSH: TSH: 1.223 u[IU]/mL (ref 0.350–4.500)

## 2013-09-10 ENCOUNTER — Ambulatory Visit (HOSPITAL_COMMUNITY): Payer: Medicaid Other

## 2013-09-12 ENCOUNTER — Telehealth: Payer: Self-pay | Admitting: Family Medicine

## 2013-09-13 NOTE — Telephone Encounter (Signed)
Attempted to reach patient . Phone out of service

## 2013-12-24 ENCOUNTER — Ambulatory Visit: Payer: Medicaid Other | Admitting: Family Medicine

## 2014-01-16 DIAGNOSIS — N3090 Cystitis, unspecified without hematuria: Secondary | ICD-10-CM | POA: Insufficient documentation

## 2014-01-16 NOTE — Assessment & Plan Note (Signed)
Stable pt encouraged to go to rehab for street drug use and also to get professional counslling tro help with depression and alternative coping strategies

## 2014-01-16 NOTE — Assessment & Plan Note (Signed)
synmptomatic , in house Ua is negative for infection, pt is aware
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
## 2014-01-16 NOTE — Assessment & Plan Note (Signed)
Controlled, no change in medication Infrequent need o use  proventil
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
## 2014-01-16 NOTE — Assessment & Plan Note (Signed)
Counseled re need to discontinue uie for health , and encouraged her to seek assistance for this through community based programs for addivction treatment, no interest, in denial, states she can quit on her own

## 2014-01-16 NOTE — Assessment & Plan Note (Signed)
Controlled, no change in medication  

## 2014-04-09 ENCOUNTER — Encounter: Payer: Self-pay | Admitting: *Deleted

## 2014-04-09 ENCOUNTER — Encounter: Payer: Medicaid Other | Admitting: Family Medicine

## 2014-05-14 ENCOUNTER — Ambulatory Visit: Payer: Medicaid Other | Admitting: Family Medicine

## 2014-05-14 ENCOUNTER — Encounter: Payer: Self-pay | Admitting: *Deleted

## 2014-06-06 ENCOUNTER — Ambulatory Visit: Payer: Medicaid Other | Admitting: Family Medicine

## 2014-09-03 ENCOUNTER — Other Ambulatory Visit: Payer: Self-pay | Admitting: Family Medicine

## 2014-09-03 ENCOUNTER — Ambulatory Visit (INDEPENDENT_AMBULATORY_CARE_PROVIDER_SITE_OTHER): Payer: Medicaid Other | Admitting: Family Medicine

## 2014-09-03 ENCOUNTER — Encounter: Payer: Self-pay | Admitting: Family Medicine

## 2014-09-03 VITALS — BP 144/82 | HR 68 | Resp 18 | Ht 64.0 in | Wt 106.0 lb

## 2014-09-03 DIAGNOSIS — Z1239 Encounter for other screening for malignant neoplasm of breast: Secondary | ICD-10-CM

## 2014-09-03 DIAGNOSIS — R296 Repeated falls: Secondary | ICD-10-CM

## 2014-09-03 DIAGNOSIS — B1710 Acute hepatitis C without hepatic coma: Secondary | ICD-10-CM

## 2014-09-03 DIAGNOSIS — M25512 Pain in left shoulder: Secondary | ICD-10-CM

## 2014-09-03 DIAGNOSIS — R29898 Other symptoms and signs involving the musculoskeletal system: Secondary | ICD-10-CM

## 2014-09-03 DIAGNOSIS — M6248 Contracture of muscle, other site: Secondary | ICD-10-CM

## 2014-09-03 DIAGNOSIS — I10 Essential (primary) hypertension: Secondary | ICD-10-CM

## 2014-09-03 DIAGNOSIS — Z0000 Encounter for general adult medical examination without abnormal findings: Secondary | ICD-10-CM

## 2014-09-03 DIAGNOSIS — R05 Cough: Secondary | ICD-10-CM

## 2014-09-03 DIAGNOSIS — M62838 Other muscle spasm: Secondary | ICD-10-CM

## 2014-09-03 DIAGNOSIS — Z9181 History of falling: Secondary | ICD-10-CM

## 2014-09-03 DIAGNOSIS — R059 Cough, unspecified: Secondary | ICD-10-CM

## 2014-09-03 MED ORDER — FLUOXETINE HCL 10 MG PO CAPS: 10.0000 mg | ORAL_CAPSULE | Freq: Every day | ORAL | Status: DC

## 2014-09-03 MED ORDER — TRAMADOL HCL 50 MG PO TABS: 50.0000 mg | ORAL_TABLET | Freq: Every day | ORAL | Status: DC

## 2014-09-03 MED ORDER — TEMAZEPAM 15 MG PO CAPS: 15.0000 mg | ORAL_CAPSULE | Freq: Every evening | ORAL | Status: DC | PRN

## 2014-09-03 MED ORDER — AMLODIPINE BESYLATE 2.5 MG PO TABS: 2.5000 mg | ORAL_TABLET | Freq: Every day | ORAL | Status: DC

## 2014-09-03 MED ORDER — ALBUTEROL SULFATE HFA 108 (90 BASE) MCG/ACT IN AERS: INHALATION_SPRAY | RESPIRATORY_TRACT | Status: DC

## 2014-09-03 MED ORDER — CYCLOBENZAPRINE HCL 10 MG PO TABS: 10.0000 mg | ORAL_TABLET | Freq: Every evening | ORAL | Status: DC | PRN

## 2014-09-03 NOTE — Progress Notes (Signed)
Subjective:    Patient ID: Yvonne Clay, female    DOB: 07/05/1955, 59 y.o.   MRN: [id number]  HPI Preventive Screening-Counseling & Management   Patient present here today for a Medicare annual wellness visit.   Current Problems (verified)   Medications Prior to Visit Allergies (verified)   PAST HISTORY  Family History (updated as able)  Social History Widowed disabled Scientist, product/process developmenttextile worker, alcoholic, and also cocaine   Risk Factors  Current exercise habits:  Limited due to pain  Dietary issues discussed:  Low fat heart healthy diet   Cardiac risk factors: htn, cocaine, alcohol use  Depression Screen  (Note: if answer to either of the following is "Yes", a more complete depression screening is indicated)   Over the past two weeks, have you felt down, depressed or hopeless? Yes Over the past two weeks, have you felt little interest or pleasure in doing things? No  Have you lost interest or pleasure in daily life? Yes Do you often feel hopeless? Yes Do you cry easily over simple problems? Yes  Activities of Daily Living  In your present state of health, do you have any difficulty performing the following activities?  Driving?: Yes,no driving in past 25 years Managing money?: Yes, money handlled through social services Feeding yourself?:No Getting from bed to chair?:No Climbing a flight of stairs?: Yes, limited due to leg pain Preparing food and eating?:yes Bathing or showering?:yes needs help, unstable, multiple falls Getting dressed?:No Getting to the toilet?:No Using the toilet?:No Moving around from place to place?: Yes  Fall Risk Assessment In the past year have you fallen or had a near fall?: Yes multiple Are you currently taking any medications that make you dizzy?:No   Hearing Difficulties: No Do you often ask people to speak up or repeat themselves?:No Do you experience ringing or noises in your ears?:No Do you have difficulty understanding soft or  whispered voices?:No  Cognitive Testing  Alert? Yes Normal Appearance?Yes , cachectic, malnourished , appears older than stated age Oriented to person? Yes Place? Yes  Time? Yes  Displays appropriate judgment?no Can read the correct time from a watch face? yes Are you having problems remembering things?yes  Advanced Directives have been discussed with the patient?Yes and brochure provided    List the Names of Other Physician/Practitioners you currently use: care teams updated    Indicate any recent Medical Services you may have received from other than [facility] providers in the past year (date may be approximate).   Assessment:    Annual Wellness Exam   Plan:     Medicare Attestation  I have personally reviewed:  The patient's medical and social history  Their use of alcohol, tobacco or illicit drugs  Their current medications and supplements  The patient's functional ability including ADLs,fall risks, home safety risks, cognitive, and hearing and visual impairment  Diet and physical activities  Evidence for depression or mood disorders  The patient's weight, height, BMI, and visual acuity have been recorded in the chart. I have made referrals, counseling, and provided education to the patient based on review of the above and I have provided the patient with a written personalized care plan for preventive services.      Review of Systems     Objective:   Physical Exam BP 144/82 mmHg  Pulse 68  Resp 18  Ht 5\' 4"  (1.626 m)  Wt 106 lb (48.081 kg)  BMI 18.19 kg/m2  SpO2 98%  Assessment & Plan:  Medicare annual wellness visit, initial Annual exam as documented. Counseling done  re healthy lifestyle.Patient's main health problems are  alcohol and drug addiction, specifically cocaine, which continues tob e an ongoing issue. She does not want to go to rehab.  Today she  is accompanied by her caregiver, who seems to have a vested interest in trying to help Yvonne Clay  who she has known for a long time She has hepatitis C and I will, attempt to have her managed through the ID clinic in [place] All cancer screening tests are past due, will start with mammogram

## 2014-09-03 NOTE — Patient Instructions (Signed)
In 2 month, call if you need me before  Need mammogram  You are referred to PT due to left arm weakness and recurrent falls  Labs today, CBC, lipid, cmp, HIV and Vit d and tSH  Medications are sent in   You need to stop drinking and smoking

## 2014-09-04 LAB — COMPREHENSIVE METABOLIC PANEL
ALBUMIN: 4.3 g/dL (ref 3.5–5.2)
ALT: 49 U/L — ABNORMAL HIGH (ref 0–35)
AST: 93 U/L — ABNORMAL HIGH (ref 0–37)
Alkaline Phosphatase: 97 U/L (ref 39–117)
BUN: 11 mg/dL (ref 6–23)
CHLORIDE: 107 meq/L (ref 96–112)
CO2: 25 mEq/L (ref 19–32)
Calcium: 9.3 mg/dL (ref 8.4–10.5)
Creat: 0.83 mg/dL (ref 0.50–1.10)
GLUCOSE: 85 mg/dL (ref 70–99)
Potassium: 4.5 mEq/L (ref 3.5–5.3)
Sodium: 142 mEq/L (ref 135–145)
TOTAL PROTEIN: 8.4 g/dL — AB (ref 6.0–8.3)
Total Bilirubin: 0.5 mg/dL (ref 0.2–1.2)

## 2014-09-04 LAB — LIPID PANEL
Cholesterol: 174 mg/dL (ref 0–200)
HDL: 97 mg/dL (ref >=46)
LDL Cholesterol: 52 mg/dL (ref 0–99)
Total CHOL/HDL Ratio: 1.8 Ratio
Triglycerides: 127 mg/dL (ref <150)
VLDL: 25 mg/dL (ref 0–40)

## 2014-09-04 LAB — VITAMIN D 25 HYDROXY (VIT D DEFICIENCY, FRACTURES): Vit D, 25-Hydroxy: 8 ng/mL — ABNORMAL LOW (ref 30–100)

## 2014-09-04 LAB — HIV ANTIBODY (ROUTINE TESTING W REFLEX): HIV 1&2 Ab, 4th Generation: NONREACTIVE

## 2014-09-04 LAB — CBC
HCT: 42.2 % (ref 36.0–46.0)
Hemoglobin: 14.5 g/dL (ref 12.0–15.0)
MCH: 33.5 pg (ref 26.0–34.0)
MCHC: 34.4 g/dL (ref 30.0–36.0)
MCV: 97.5 fL (ref 78.0–100.0)
MPV: 9.5 fL (ref 8.6–12.4)
PLATELETS: 242 10*3/uL (ref 150–400)
RBC: 4.33 MIL/uL (ref 3.87–5.11)
RDW: 12.5 % (ref 11.5–15.5)
WBC: 4.5 10*3/uL (ref 4.0–10.5)

## 2014-09-04 LAB — TSH: TSH: 1.467 u[IU]/mL (ref 0.350–4.500)

## 2014-09-05 LAB — HEPATITIS PANEL, ACUTE
HCV Ab: REACTIVE — AB
HEP B C IGM: NONREACTIVE
Hep A IgM: NONREACTIVE
Hepatitis B Surface Ag: NEGATIVE

## 2014-09-05 LAB — HEPATITIS C RNA QUANTITATIVE
HCV QUANT: 857119 [IU]/mL — AB (ref <15)
HCV Quantitative Log: 5.93 {Log} — ABNORMAL HIGH (ref <1.18)

## 2014-09-06 DIAGNOSIS — Z0000 Encounter for general adult medical examination without abnormal findings: Secondary | ICD-10-CM | POA: Insufficient documentation

## 2014-09-06 NOTE — Assessment & Plan Note (Addendum)
Annual exam as documented. Counseling done  re healthy lifestyle.Patient's main health problems are  alcohol and drug addiction, specifically cocaine, which continues tob e an ongoing issue. She does not want to go to rehab.  Today she  is accompanied by her caregiver, who seems to have a vested interest in trying to help Yvonne Clay who she has known for a long time She has hepatitis C and I will, attempt to have [place] All cancer screening tests are past due, will start with mammogram

## 2014-09-12 MED ORDER — VITAMIN D (ERGOCALCIFEROL) 1.25 MG (50000 UNIT) PO CAPS: 50000.0000 [IU] | ORAL_CAPSULE | ORAL | Status: DC

## 2014-09-12 NOTE — Addendum Note (Signed)
Addended by: Kandis FantasiaSLADE, Ludmilla Mcgillis B on: 09/12/2014 11:05 AM   Modules accepted: Orders

## 2014-09-17 ENCOUNTER — Telehealth: Payer: Self-pay | Admitting: *Deleted

## 2014-09-17 NOTE — Telephone Encounter (Signed)
Called patient and left a message to call the clinic to schedule a lab appt. Once labs are done she can be scheduled with Dr. Luciana Axeomer in June. She can be scheduled any time for labs. Yvonne Clay

## 2014-09-20 ENCOUNTER — Other Ambulatory Visit: Payer: Medicaid Other

## 2014-09-23 ENCOUNTER — Ambulatory Visit (HOSPITAL_COMMUNITY): Payer: MEDICAID

## 2014-09-25 ENCOUNTER — Ambulatory Visit (HOSPITAL_COMMUNITY): Payer: MEDICAID

## 2014-10-01 ENCOUNTER — Ambulatory Visit (HOSPITAL_COMMUNITY)
Admission: RE | Admit: 2014-10-01 | Discharge: 2014-10-01 | Disposition: A | Discharge status: Home / Self Care | Payer: Medicaid Other | Source: Ambulatory Visit | Attending: Family Medicine | Admitting: Family Medicine

## 2014-10-01 DIAGNOSIS — R05 Cough: Secondary | ICD-10-CM | POA: Diagnosis not present

## 2014-10-01 DIAGNOSIS — R059 Cough, unspecified: Secondary | ICD-10-CM

## 2014-10-10 ENCOUNTER — Telehealth (HOSPITAL_COMMUNITY): Payer: Self-pay

## 2014-10-10 ENCOUNTER — Ambulatory Visit (HOSPITAL_COMMUNITY): Payer: Medicaid Other | Attending: Family Medicine

## 2014-10-10 NOTE — Telephone Encounter (Signed)
PT attempted to call pt to check in after no-show. Female answered but reported that it was the wrong number.  2:33 PM 10/10/2014  Rosamaria Lints, PT, DPT [place] License # [id number]

## 2014-10-11 ENCOUNTER — Other Ambulatory Visit: Payer: Self-pay | Admitting: Family Medicine

## 2014-10-11 ENCOUNTER — Ambulatory Visit (HOSPITAL_COMMUNITY): Payer: Medicaid Other

## 2014-10-11 ENCOUNTER — Ambulatory Visit (HOSPITAL_COMMUNITY)
Admission: RE | Admit: 2014-10-11 | Discharge: 2014-10-11 | Disposition: A | Discharge status: Home / Self Care | Payer: Medicaid Other | Source: Ambulatory Visit | Attending: Family Medicine | Admitting: Family Medicine

## 2014-10-11 ENCOUNTER — Ambulatory Visit (HOSPITAL_COMMUNITY): Admission: RE | Admit: 2014-10-11 | Payer: Medicaid Other | Source: Ambulatory Visit

## 2014-10-11 DIAGNOSIS — Z1231 Encounter for screening mammogram for malignant neoplasm of breast: Secondary | ICD-10-CM | POA: Insufficient documentation

## 2014-10-16 ENCOUNTER — Ambulatory Visit (HOSPITAL_COMMUNITY): Payer: Medicaid Other

## 2014-10-16 ENCOUNTER — Emergency Department (HOSPITAL_COMMUNITY)
Admission: EM | Admit: 2014-10-16 | Discharge: 2014-10-17 | Disposition: A | Discharge status: Home / Self Care | Payer: Medicaid Other | Attending: Emergency Medicine | Admitting: Emergency Medicine

## 2014-10-16 ENCOUNTER — Other Ambulatory Visit: Payer: Self-pay | Admitting: Family Medicine

## 2014-10-16 ENCOUNTER — Encounter (HOSPITAL_COMMUNITY): Payer: Self-pay

## 2014-10-16 DIAGNOSIS — S2231XA Fracture of one rib, right side, initial encounter for closed fracture: Secondary | ICD-10-CM

## 2014-10-16 DIAGNOSIS — R110 Nausea: Secondary | ICD-10-CM | POA: Insufficient documentation

## 2014-10-16 DIAGNOSIS — Y999 Unspecified external cause status: Secondary | ICD-10-CM | POA: Diagnosis not present

## 2014-10-16 DIAGNOSIS — R42 Dizziness and giddiness: Secondary | ICD-10-CM | POA: Diagnosis not present

## 2014-10-16 DIAGNOSIS — Z8739 Personal history of other diseases of the musculoskeletal system and connective tissue: Secondary | ICD-10-CM | POA: Diagnosis not present

## 2014-10-16 DIAGNOSIS — S2241XA Multiple fractures of ribs, right side, initial encounter for closed fracture: Secondary | ICD-10-CM | POA: Diagnosis not present

## 2014-10-16 DIAGNOSIS — Y939 Activity, unspecified: Secondary | ICD-10-CM | POA: Diagnosis not present

## 2014-10-16 DIAGNOSIS — Z79899 Other long term (current) drug therapy: Secondary | ICD-10-CM | POA: Insufficient documentation

## 2014-10-16 DIAGNOSIS — R928 Other abnormal and inconclusive findings on diagnostic imaging of breast: Secondary | ICD-10-CM

## 2014-10-16 DIAGNOSIS — Y929 Unspecified place or not applicable: Secondary | ICD-10-CM | POA: Diagnosis not present

## 2014-10-16 DIAGNOSIS — Z720 Tobacco use: Secondary | ICD-10-CM | POA: Diagnosis not present

## 2014-10-16 DIAGNOSIS — Z880 Allergy status to penicillin: Secondary | ICD-10-CM | POA: Diagnosis not present

## 2014-10-16 DIAGNOSIS — I10 Essential (primary) hypertension: Secondary | ICD-10-CM | POA: Diagnosis not present

## 2014-10-16 DIAGNOSIS — Z8619 Personal history of other infectious and parasitic diseases: Secondary | ICD-10-CM | POA: Insufficient documentation

## 2014-10-16 DIAGNOSIS — J449 Chronic obstructive pulmonary disease, unspecified: Secondary | ICD-10-CM | POA: Insufficient documentation

## 2014-10-16 DIAGNOSIS — S29001A Unspecified injury of muscle and tendon of front wall of thorax, initial encounter: Secondary | ICD-10-CM | POA: Diagnosis present
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
## 2014-10-17 ENCOUNTER — Emergency Department (HOSPITAL_COMMUNITY): Payer: Medicaid Other

## 2014-10-17 MED ORDER — OXYCODONE-ACETAMINOPHEN 5-325 MG PO TABS: 1.0000 | ORAL_TABLET | ORAL | Status: DC | PRN
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
## 2014-10-17 NOTE — ED Notes (Signed)
Dr Glick at bedside,  

## 2014-10-21 ENCOUNTER — Emergency Department (HOSPITAL_COMMUNITY)
Admission: EM | Admit: 2014-10-21 | Discharge: 2014-10-22 | Discharge status: Against Medical Advice | Payer: Medicaid Other | Attending: Emergency Medicine | Admitting: Emergency Medicine

## 2014-10-21 ENCOUNTER — Other Ambulatory Visit: Payer: Self-pay | Admitting: Family Medicine

## 2014-10-21 ENCOUNTER — Encounter (HOSPITAL_COMMUNITY): Payer: Self-pay | Admitting: *Deleted

## 2014-10-21 ENCOUNTER — Other Ambulatory Visit: Payer: Self-pay

## 2014-10-21 DIAGNOSIS — S51812A Laceration without foreign body of left forearm, initial encounter: Secondary | ICD-10-CM | POA: Diagnosis present

## 2014-10-21 DIAGNOSIS — W260XXA Contact with knife, initial encounter: Secondary | ICD-10-CM | POA: Insufficient documentation

## 2014-10-21 DIAGNOSIS — I10 Essential (primary) hypertension: Secondary | ICD-10-CM | POA: Insufficient documentation

## 2014-10-21 DIAGNOSIS — R928 Other abnormal and inconclusive findings on diagnostic imaging of breast: Secondary | ICD-10-CM

## 2014-10-21 DIAGNOSIS — Y998 Other external cause status: Secondary | ICD-10-CM | POA: Insufficient documentation

## 2014-10-21 DIAGNOSIS — J449 Chronic obstructive pulmonary disease, unspecified: Secondary | ICD-10-CM | POA: Diagnosis not present

## 2014-10-21 DIAGNOSIS — Z720 Tobacco use: Secondary | ICD-10-CM | POA: Insufficient documentation

## 2014-10-21 DIAGNOSIS — Y9389 Activity, other specified: Secondary | ICD-10-CM | POA: Insufficient documentation

## 2014-10-21 DIAGNOSIS — Y9289 Other specified places as the place of occurrence of the external cause: Secondary | ICD-10-CM | POA: Diagnosis not present

## 2014-10-21 MED ORDER — IBUPROFEN 800 MG PO TABS: 800.0000 mg | ORAL_TABLET | Freq: Two times a day (BID) | ORAL | Status: DC | PRN
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
## 2014-10-21 NOTE — ED Notes (Signed)
Called x 1, no answer; people in waiting room state pt was seen leaving

## 2014-10-22 ENCOUNTER — Encounter: Payer: Self-pay | Admitting: Family Medicine

## 2014-10-22 ENCOUNTER — Ambulatory Visit (INDEPENDENT_AMBULATORY_CARE_PROVIDER_SITE_OTHER): Payer: Medicaid Other | Admitting: Family Medicine

## 2014-10-22 VITALS — BP 130/80 | HR 88 | Resp 16 | Ht 64.0 in | Wt 112.0 lb

## 2014-10-22 DIAGNOSIS — J45909 Unspecified asthma, uncomplicated: Secondary | ICD-10-CM | POA: Diagnosis not present

## 2014-10-22 DIAGNOSIS — Z0000 Encounter for general adult medical examination without abnormal findings: Secondary | ICD-10-CM | POA: Diagnosis not present

## 2014-10-22 DIAGNOSIS — S2242XD Multiple fractures of ribs, left side, subsequent encounter for fracture with routine healing: Secondary | ICD-10-CM | POA: Diagnosis not present

## 2014-10-22 DIAGNOSIS — Z720 Tobacco use: Secondary | ICD-10-CM

## 2014-10-22 DIAGNOSIS — S2249XA Multiple fractures of ribs, unspecified side, initial encounter for closed fracture: Secondary | ICD-10-CM | POA: Insufficient documentation

## 2014-10-22 DIAGNOSIS — G4700 Insomnia, unspecified: Secondary | ICD-10-CM

## 2014-10-22 DIAGNOSIS — F17200 Nicotine dependence, unspecified, uncomplicated: Secondary | ICD-10-CM

## 2014-10-22 DIAGNOSIS — I10 Essential (primary) hypertension: Secondary | ICD-10-CM

## 2014-10-22 MED ORDER — KETOROLAC TROMETHAMINE 60 MG/2ML IM SOLN
60.0000 mg | Freq: Once | INTRAMUSCULAR | Status: AC
  Administered 2014-10-22: 60 mg via INTRAMUSCULAR

## 2014-10-22 MED ORDER — OXYCODONE-ACETAMINOPHEN 5-325 MG PO TABS: ORAL_TABLET | ORAL | Status: DC

## 2014-10-22 MED ORDER — BUDESONIDE-FORMOTEROL FUMARATE 160-4.5 MCG/ACT IN AERO: 2.0000 | INHALATION_SPRAY | Freq: Two times a day (BID) | RESPIRATORY_TRACT | Status: DC

## 2014-10-22 NOTE — ED Notes (Signed)
Pt called x 2 with no answer  

## 2014-10-22 NOTE — Assessment & Plan Note (Signed)

## 2014-10-22 NOTE — Patient Instructions (Addendum)
You have fractured ribs  CANNOT use any street drugs or alcohol  Sign for pain contract stating only pain meds from this office  Two week supply today, and collect in 2 weeks from an additional 2 week supply, hopefully no more after 1 month   Toradol in office for pain  F/u as before 7/11

## 2014-10-25 ENCOUNTER — Other Ambulatory Visit: Payer: Medicaid Other

## 2014-10-28 MED FILL — Oxycodone w/ Acetaminophen Tab 5-325 MG: ORAL | Qty: 6 | Status: AC

## 2014-11-05 ENCOUNTER — Other Ambulatory Visit: Payer: Self-pay

## 2014-11-05 ENCOUNTER — Inpatient Hospital Stay (HOSPITAL_COMMUNITY): Admission: RE | Admit: 2014-11-05 | Payer: Medicaid Other | Source: Ambulatory Visit

## 2014-11-05 DIAGNOSIS — Z0000 Encounter for general adult medical examination without abnormal findings: Secondary | ICD-10-CM

## 2014-11-05 MED ORDER — OXYCODONE-ACETAMINOPHEN 5-325 MG PO TABS: ORAL_TABLET | ORAL | Status: DC

## 2014-11-11 ENCOUNTER — Encounter: Payer: Medicaid Other | Admitting: Family Medicine

## 2014-11-11 ENCOUNTER — Encounter: Payer: Self-pay | Admitting: *Deleted

## 2014-11-15 ENCOUNTER — Other Ambulatory Visit: Payer: Self-pay

## 2014-11-15 DIAGNOSIS — Z0000 Encounter for general adult medical examination without abnormal findings: Secondary | ICD-10-CM

## 2014-11-15 MED ORDER — OXYCODONE-ACETAMINOPHEN 5-325 MG PO TABS: ORAL_TABLET | ORAL | Status: DC

## 2014-11-26 ENCOUNTER — Encounter (HOSPITAL_COMMUNITY): Payer: Medicaid Other

## 2014-11-29 ENCOUNTER — Other Ambulatory Visit: Payer: Self-pay

## 2014-11-29 DIAGNOSIS — Z0000 Encounter for general adult medical examination without abnormal findings: Secondary | ICD-10-CM

## 2014-11-29 MED ORDER — OXYCODONE-ACETAMINOPHEN 5-325 MG PO TABS: ORAL_TABLET | ORAL | Status: DC

## 2014-12-03 ENCOUNTER — Encounter (HOSPITAL_COMMUNITY): Payer: Medicaid Other

## 2014-12-04 ENCOUNTER — Other Ambulatory Visit: Payer: Medicaid Other

## 2014-12-08 ENCOUNTER — Other Ambulatory Visit: Payer: Self-pay | Admitting: Family Medicine

## 2014-12-08 ENCOUNTER — Telehealth: Payer: Self-pay | Admitting: Family Medicine

## 2014-12-08 NOTE — Assessment & Plan Note (Signed)
Encouraged attendance at classes to help with quitting drug and alcohol use, the danger of using street drugs and alcohol together is also stressed

## 2014-12-08 NOTE — Assessment & Plan Note (Signed)
Controlled, no change in medication  
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
## 2014-12-08 NOTE — Assessment & Plan Note (Signed)
Sleep hygiene reviewed and written information offered also. Prescription sent for  medication needed.  
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
## 2014-12-09 NOTE — Telephone Encounter (Signed)
Noted  

## 2014-12-18 ENCOUNTER — Ambulatory Visit (INDEPENDENT_AMBULATORY_CARE_PROVIDER_SITE_OTHER): Payer: Medicaid Other | Admitting: Family Medicine

## 2014-12-18 ENCOUNTER — Encounter: Payer: Self-pay | Admitting: Family Medicine

## 2014-12-18 VITALS — BP 122/80 | HR 72 | Temp 98.2°F | Resp 18 | Ht 64.0 in | Wt 116.1 lb

## 2014-12-18 DIAGNOSIS — F17200 Nicotine dependence, unspecified, uncomplicated: Secondary | ICD-10-CM

## 2014-12-18 DIAGNOSIS — Z720 Tobacco use: Secondary | ICD-10-CM | POA: Diagnosis not present

## 2014-12-18 DIAGNOSIS — J209 Acute bronchitis, unspecified: Secondary | ICD-10-CM

## 2014-12-18 DIAGNOSIS — I10 Essential (primary) hypertension: Secondary | ICD-10-CM

## 2014-12-18 DIAGNOSIS — F10288 Alcohol dependence with other alcohol-induced disorder: Secondary | ICD-10-CM | POA: Diagnosis not present

## 2014-12-18 MED ORDER — BENZONATATE 100 MG PO CAPS: 100.0000 mg | ORAL_CAPSULE | Freq: Two times a day (BID) | ORAL | Status: DC

## 2014-12-18 MED ORDER — LEVOFLOXACIN 500 MG PO TABS: 500.0000 mg | ORAL_TABLET | Freq: Every day | ORAL | Status: DC

## 2014-12-18 NOTE — Progress Notes (Signed)
   Subjective:    Patient ID: Yvonne Clay, female    DOB: 07-16-1955, 59 y.o.   MRN: [id number]  HPI 1 week h/o cough productive of green sputum and chills intermittently, denies nasal drainage , sinus pressure or sore throat Increased wheezing and shortness of breath   Review of Systems See HPI Denies chest pains, palpitations and leg swelling Denies abdominal pain, nausea, vomiting,diarrhea or constipation.   Denies dysuria, frequency, hesitancy or incontinence. Denies joint pain, swelling and limitation in mobility. Denies headaches, seizures, numbness, or tingling. Denies depression, anxiety or insomnia. Denies skin break down or rash.        Objective:   Physical Exam  BP 122/80 mmHg  Pulse 72  Temp(Src) 98.2 F (36.8 C)  Resp 18  Ht  (1.626 m)  Wt 116 lb 1.3 oz (52.654 kg)  BMI 19.92 kg/m2  SpO2 99% Patient alert and oriented and in no cardiopulmonary distress.Chronically ill appearing andmalnoyurished  HEENT: No facial asymmetry, EOMI,   oropharynx pink and moist.  Neck supple no JVD, no mass. No sinus tenderness, TM clear bilaterally Chest: decreased though adequate air entry, scattered crackles and wheezes bilaterally CVS: S1, S2 no murmurs, no S3.Regular rate.  ABD: Soft non tender.   Ext: No edema  MS: Adequate ROM spine, shoulders, hips and knees.  Skin: Intact, no ulcerations or rash noted.  Psych: Good eye contact, normal affect. Memory intact not anxious or depressed appearing.  CNS: CN 2-12 intact, power,  normal throughout.no focal deficits noted.       Assessment & Plan:  Acute bronchitis Decongestant and antibiotic prescribed and pt to use tylenol for pain and fever  HTN (hypertension) Controlled, no change in medication DASH diet and commitment to daily physical activity for a minimum of 30 minutes discussed and encouraged, as a part of hypertension management. The importance of attaining a healthy weight is also  discussed.  BP/Weight 01/02/2015 12/23/2014 12/23/2014 12/22/2014 12/18/2014 10/22/2014 10/21/2014  Systolic BP 120 - 103 - 122 130 152  Diastolic BP 62 - 60 - 80 80 88  Wt. (Lbs) 117 114 - 114 116.08 112 110  BMI 20.07 19.27 - 19.27 19.92 19.22 18.87        Alcohol dependence Ongoing alcohol use places pt at high risk for infection and chronic malnutrition, she remains in denial always stating "I can quit' again counseled and encouraged to join AA for help with alcohol and drug  addiction  TOBACCO USER Patient counseled for approximately 5 minutes regarding the health risks of ongoing nicotine use, specifically all types of cancer, heart disease, stroke and respiratory failure. The options available for help with cessation ,the behavioral changes to assist the process, and the option to either gradully reduce usage  Or abruptly stop.is also discussed. Pt is also encouraged to set specific goals in number of cigarettes used daily, as well as to set a quit date.  Number of cigarettes/cigars currently smoking daily: 15

## 2014-12-18 NOTE — Patient Instructions (Addendum)
Come in  [place] for fl;u vaccine  F/U in December as before  Levaquin and tessalon perles are prescribed to treat your bronchitis, take entire course of medicatrion as prescribed  Tylenol 325 mg one twice daily as needed for chest wall pain with cough , this is OTC

## 2014-12-22 ENCOUNTER — Observation Stay (HOSPITAL_COMMUNITY)
Admission: EM | Admit: 2014-12-22 | Discharge: 2014-12-23 | Disposition: A | Discharge status: Home / Self Care | Payer: Medicaid Other | Attending: Family Medicine | Admitting: Family Medicine

## 2014-12-22 ENCOUNTER — Encounter (HOSPITAL_COMMUNITY): Payer: Self-pay | Admitting: Emergency Medicine

## 2014-12-22 ENCOUNTER — Emergency Department (HOSPITAL_COMMUNITY): Payer: Medicaid Other

## 2014-12-22 DIAGNOSIS — Z79899 Other long term (current) drug therapy: Secondary | ICD-10-CM | POA: Insufficient documentation

## 2014-12-22 DIAGNOSIS — I639 Cerebral infarction, unspecified: Secondary | ICD-10-CM | POA: Diagnosis not present

## 2014-12-22 DIAGNOSIS — F329 Major depressive disorder, single episode, unspecified: Secondary | ICD-10-CM | POA: Diagnosis not present

## 2014-12-22 DIAGNOSIS — J449 Chronic obstructive pulmonary disease, unspecified: Secondary | ICD-10-CM | POA: Diagnosis present

## 2014-12-22 DIAGNOSIS — Z880 Allergy status to penicillin: Secondary | ICD-10-CM | POA: Insufficient documentation

## 2014-12-22 DIAGNOSIS — Z7982 Long term (current) use of aspirin: Secondary | ICD-10-CM | POA: Insufficient documentation

## 2014-12-22 DIAGNOSIS — M1990 Unspecified osteoarthritis, unspecified site: Secondary | ICD-10-CM | POA: Diagnosis not present

## 2014-12-22 DIAGNOSIS — Z8619 Personal history of other infectious and parasitic diseases: Secondary | ICD-10-CM | POA: Insufficient documentation

## 2014-12-22 DIAGNOSIS — F10929 Alcohol use, unspecified with intoxication, unspecified: Secondary | ICD-10-CM | POA: Diagnosis present

## 2014-12-22 DIAGNOSIS — I10 Essential (primary) hypertension: Secondary | ICD-10-CM | POA: Diagnosis present

## 2014-12-22 DIAGNOSIS — F17200 Nicotine dependence, unspecified, uncomplicated: Secondary | ICD-10-CM | POA: Diagnosis present

## 2014-12-22 DIAGNOSIS — R200 Anesthesia of skin: Secondary | ICD-10-CM | POA: Diagnosis present

## 2014-12-22 DIAGNOSIS — F1020 Alcohol dependence, uncomplicated: Secondary | ICD-10-CM | POA: Diagnosis present

## 2014-12-22 DIAGNOSIS — Z720 Tobacco use: Secondary | ICD-10-CM | POA: Insufficient documentation

## 2014-12-22 DIAGNOSIS — B1920 Unspecified viral hepatitis C without hepatic coma: Secondary | ICD-10-CM

## 2014-12-22 DIAGNOSIS — I6350 Cerebral infarction due to unspecified occlusion or stenosis of unspecified cerebral artery: Secondary | ICD-10-CM

## 2014-12-22 HISTORY — DX: Unspecified viral hepatitis C without hepatic coma: B19.20

## 2014-12-22 LAB — DIFFERENTIAL
Basophils Absolute: 0 10*3/uL (ref 0.0–0.1)
Basophils Relative: 1 % (ref 0–1)
EOS PCT: 5 % (ref 0–5)
Eosinophils Absolute: 0.3 10*3/uL (ref 0.0–0.7)
LYMPHS ABS: 3 10*3/uL (ref 0.7–4.0)
LYMPHS PCT: 51 % — AB (ref 12–46)
Monocytes Absolute: 0.4 10*3/uL (ref 0.1–1.0)
Monocytes Relative: 7 % (ref 3–12)
NEUTROS PCT: 36 % — AB (ref 43–77)
Neutro Abs: 2 10*3/uL (ref 1.7–7.7)

## 2014-12-22 LAB — CBC
HEMATOCRIT: 37.7 % (ref 36.0–46.0)
HEMOGLOBIN: 12.8 g/dL (ref 12.0–15.0)
MCH: 33.7 pg (ref 26.0–34.0)
MCHC: 34 g/dL (ref 30.0–36.0)
MCV: 99.2 fL (ref 78.0–100.0)
Platelets: 211 10*3/uL (ref 150–400)
RBC: 3.8 MIL/uL — AB (ref 3.87–5.11)
RDW: 12.9 % (ref 11.5–15.5)
WBC: 5.7 10*3/uL (ref 4.0–10.5)

## 2014-12-22 NOTE — ED Notes (Signed)
Pt stated that she started developing numbness of rt side on Friday - has been having unsteady gait and has also had some increase difficulty with speech -

## 2014-12-22 NOTE — ED Provider Notes (Signed)
CSN: [id number]     Arrival date & time 12/22/14  2235 History  This chart was scribed for Loren Racer, MD by Budd Palmer, ED Scribe. This patient was seen in room APA01/[facility]01 and the patient's care was started at 11:12 PM.     Chief Complaint  Patient presents with  . Numbness   The history is provided by the patient. No language interpreter was used.   HPI Comments: Yvonne Clay is a 59 y.o. female with a PMHx of HTN who presents to the Emergency Department complaining of right-sided numbness onset 2-3 days ago. She is unclear on the exact time of onset. She notes associated pins-and needles tingling on the right side of her face and body, worse in the right hand. She reports associated lightheadedness, loss of balance when walking. She notes exacerbation of the symptoms with standing up. She denies a PMHx of stroke. Denies focal weakness. She states she has persistent poor vision that is unchanged.  Past Medical History  Diagnosis Date  . Substance abuse   . Depression   . COPD (chronic obstructive pulmonary disease)   . Arthritis   . Hepatitis C dx in 2011 by dentist  . Hypertension    Past Surgical History  Procedure Laterality Date  . Abdominal hysterectomy    . Fracture surgery      right leg    Family History  Problem Relation Age of Onset  . Family history unknown: Yes   Social History  Substance Use Topics  . Smoking status: Current Every Day Smoker -- 0.30 packs/day  . Smokeless tobacco: None  . Alcohol Use: 7.2 oz/week    12 Cans of beer per week     Comment: patient states she drinks about two forty oz drinks a week   OB History    No data available     Review of Systems  Constitutional: Negative for fever and chills.  Eyes: Negative for visual disturbance.  Respiratory: Negative for shortness of breath.   Cardiovascular: Negative for chest pain.  Gastrointestinal: Negative for nausea, vomiting and abdominal pain.  Musculoskeletal: Negative  for back pain and neck pain.  Neurological: Positive for dizziness, speech difficulty, light-headedness and numbness. Negative for syncope, weakness and headaches.  All other systems reviewed and are negative.   Allergies  Penicillins  Home Medications   Prior to Admission medications   Medication Sig Start Date End Date Taking? Authorizing Provider  albuterol (PROVENTIL HFA) 108 (90 BASE) MCG/ACT inhaler Every 6-8 hours as needed Patient taking differently: Inhale 1-2 puffs into the lungs every 6 (six) hours as needed for wheezing or shortness of breath. Every 6-8 hours as needed 09/03/14  Yes Kerri Perches, MD  amLODipine (NORVASC) 2.5 MG tablet Take 1 tablet (2.5 mg total) by mouth daily. 09/03/14 09/03/15 Yes Kerri Perches, MD  ASPIRIN EC PO Take 2 tablets by mouth once as needed.   Yes Historical Provider, MD  benzonatate (TESSALON PERLES) 100 MG capsule Take 1 capsule (100 mg total) by mouth 2 (two) times daily. Patient not taking: Reported on 12/22/2014 12/18/14 12/18/15  Kerri Perches, MD  budesonide-formoterol [facility]) 160-4.5 MCG/ACT inhaler Inhale 2 puffs into the lungs 2 (two) times daily. 10/22/14   Kerri Perches, MD  cyclobenzaprine (FLEXERIL) 10 MG tablet Take 1 tablet (10 mg total) by mouth at bedtime as needed for muscle spasms. Take one tablet by mouth at bedtime as needed 09/03/14   Kerri Perches, MD  FLUoxetine (  PROZAC) 10 MG capsule Take 1 capsule (10 mg total) by mouth daily. Patient not taking: Reported on 12/22/2014 09/03/14   Kerri Perches, MD  ibuprofen (ADVIL,MOTRIN) 800 MG tablet Take 1 tablet (800 mg total) by mouth 2 (two) times daily as needed. 10/21/14   Kerri Perches, MD  levofloxacin (LEVAQUIN) 500 MG tablet Take 1 tablet (500 mg total) by mouth daily. Patient not taking: Reported on 12/22/2014 12/18/14   Kerri Perches, MD  oxyCODONE-acetaminophen (PERCOCET/ROXICET) 5-325 MG per tablet TAKE ONE TABLET BY MOUTH ONCE DAILY 11/05/14    Historical Provider, MD  temazepam (RESTORIL) 15 MG capsule Take 1 capsule (15 mg total) by mouth at bedtime as needed. Patient taking differently: Take 15 mg by mouth at bedtime as needed for sleep.  09/03/14   Kerri Perches, MD  Vitamin D, Ergocalciferol, (DRISDOL) 50000 UNITS CAPS capsule Take 1 capsule (50,000 Units total) by mouth every 7 (seven) days. 09/12/14   Kerri Perches, MD   BP 123/69 mmHg  Pulse 88  Temp(Src) 97.9 F (36.6 C) (Oral)  Resp 18  Ht 5' 4.5" (1.638 m)  Wt 114 lb (51.71 kg)  BMI 19.27 kg/m2  SpO2 97% Physical Exam  Constitutional: She is oriented to person, place, and time. She appears well-developed and well-nourished. No distress.  HENT:  Head: Normocephalic and atraumatic.  Mouth/Throat: Oropharynx is clear and moist. No oropharyngeal exudate.  Eyes: EOM are normal.  Left pupil is dilated and irregular. Minimally reactive. Right pupil is 3 mm and reactive. Denies previous cataract surgery.  Neck: Normal range of motion. Neck supple.  Cardiovascular: Normal rate and regular rhythm.  Exam reveals no gallop and no friction rub.   No murmur heard. Pulmonary/Chest: Effort normal and breath sounds normal. No respiratory distress. She has no wheezes. She has no rales.  Abdominal: Soft. Bowel sounds are normal. She exhibits no distension and no mass. There is no tenderness. There is no rebound and no guarding.  Musculoskeletal: Normal range of motion. She exhibits no edema or tenderness.  No calf swelling or tenderness.  Neurological: She is alert and oriented to person, place, and time.  5/5 motor in right upper, left upper and left lower extremities. 4/5 motor in right lower extremity. Decreased sensation to the right side of the face, right upper and right lower extremities. Bilateral finger-to-nose testing intact. Speech is clear  Skin: Skin is warm and dry. No rash noted. No erythema.  Psychiatric: She has a normal mood and affect. Her behavior is  normal.  Nursing note and vitals reviewed.   ED Course  Procedures  DIAGNOSTIC STUDIES: Oxygen Saturation is 95% on RA, adequate by my interpretation.    COORDINATION OF CARE: 11:17 PM - Discussed plans to order diagnostic studies and imaging. Pt advised of plan for treatment and pt agrees.  Labs Review Labs Reviewed  CBC - Abnormal; Notable for the following:    RBC 3.80 (*)    All other components within normal limits  DIFFERENTIAL - Abnormal; Notable for the following:    Neutrophils Relative % 36 (*)    Lymphocytes Relative 51 (*)    All other components within normal limits  COMPREHENSIVE METABOLIC PANEL - Abnormal; Notable for the following:    Potassium 3.3 (*)    Glucose, Bld 117 (*)    Calcium 8.7 (*)    AST 46 (*)    Total Bilirubin 0.2 (*)    All other components within normal limits  URINALYSIS,  ROUTINE W REFLEX MICROSCOPIC (NOT AT [facility]) - Abnormal; Notable for the following:    Leukocytes, UA MODERATE (*)    All other components within normal limits  ETHANOL - Abnormal; Notable for the following:    Alcohol, Ethyl (B) 108 (*)    All other components within normal limits  URINE MICROSCOPIC-ADD ON - Abnormal; Notable for the following:    Squamous Epithelial / LPF FEW (*)    Bacteria, UA MANY (*)    All other components within normal limits  PROTIME-INR  APTT  I-STAT TROPOININ, ED    Imaging Review Ct Head Wo Contrast  12/23/2014   CLINICAL DATA:  Right hand numbness and sharp intermittent pain radiating to the right leg. Pain for 1 week. Cough, chills, and congestion for 1 week.  EXAM: CT HEAD WITHOUT CONTRAST  TECHNIQUE: Contiguous axial images were obtained from the base of the skull through the vertex without intravenous contrast.  COMPARISON:  10/17/2014  FINDINGS: Ventricles and sulci appear symmetrical. No ventricular dilatation. No mass effect or midline shift. No abnormal extra-axial fluid collections. Gray-white matter junctions are distinct. Basal  cisterns are not effaced. No evidence of acute intracranial hemorrhage. No depressed skull fractures. Mucosal thickening in the paranasal sinuses with opacification of the right ethmoid air cell. Mastoid air cells are not opacified. Vascular calcifications.  IMPRESSION: No acute intracranial abnormalities. Mucosal thickening in the paranasal sinuses, likely inflammatory.   Electronically Signed   By: Burman Nieves M.D.   On: 12/23/2014 01:27   I have personally reviewed and evaluated these images and lab results as part of my medical decision-making.   EKG Interpretation   Date/Time:  Sunday December 22 2014 23:44:07 EDT Ventricular Rate:  77 PR Interval:  186 QRS Duration: 74 QT Interval:  374 QTC Calculation: 423 R Axis:   38 Text Interpretation:  Sinus rhythm Atrial premature complex Confirmed by  Ranae Palms  MD, Adalia Pettis ([id number]) on 12/23/2014 12:12:05 AM      MDM   Final diagnoses:  CVA (cerebral infarction)   I personally performed the services described in this documentation, which was scribed in my presence. The recorded information has been reviewed and is accurate.  Discussed with [facility], Dr. Nedra Hai will admit for further workup.  Loren Racer, MD 12/23/14 [id number]

## 2014-12-22 NOTE — ED Notes (Signed)
Md informed of pt's complaint and that it has been occuring since Friday - Verbalized understanding

## 2014-12-23 ENCOUNTER — Encounter (HOSPITAL_COMMUNITY): Payer: Self-pay | Admitting: Internal Medicine

## 2014-12-23 ENCOUNTER — Observation Stay (HOSPITAL_BASED_OUTPATIENT_CLINIC_OR_DEPARTMENT_OTHER): Payer: Medicaid Other

## 2014-12-23 ENCOUNTER — Observation Stay (HOSPITAL_COMMUNITY): Payer: Medicaid Other

## 2014-12-23 DIAGNOSIS — I6350 Cerebral infarction due to unspecified occlusion or stenosis of unspecified cerebral artery: Secondary | ICD-10-CM

## 2014-12-23 DIAGNOSIS — B1920 Unspecified viral hepatitis C without hepatic coma: Secondary | ICD-10-CM

## 2014-12-23 DIAGNOSIS — I639 Cerebral infarction, unspecified: Secondary | ICD-10-CM | POA: Diagnosis not present

## 2014-12-23 DIAGNOSIS — Z720 Tobacco use: Secondary | ICD-10-CM | POA: Diagnosis not present

## 2014-12-23 DIAGNOSIS — F10120 Alcohol abuse with intoxication, uncomplicated: Secondary | ICD-10-CM

## 2014-12-23 DIAGNOSIS — F1020 Alcohol dependence, uncomplicated: Secondary | ICD-10-CM

## 2014-12-23 DIAGNOSIS — I10 Essential (primary) hypertension: Secondary | ICD-10-CM

## 2014-12-23 DIAGNOSIS — F10929 Alcohol use, unspecified with intoxication, unspecified: Secondary | ICD-10-CM | POA: Diagnosis present

## 2014-12-23 HISTORY — DX: Unspecified viral hepatitis C without hepatic coma: B19.20

## 2014-12-23 LAB — COMPREHENSIVE METABOLIC PANEL
ALBUMIN: 3.7 g/dL (ref 3.5–5.0)
ALK PHOS: 104 U/L (ref 38–126)
ALT: 38 U/L (ref 14–54)
ANION GAP: 8 (ref 5–15)
AST: 46 U/L — ABNORMAL HIGH (ref 15–41)
BILIRUBIN TOTAL: 0.2 mg/dL — AB (ref 0.3–1.2)
BUN: 10 mg/dL (ref 6–20)
CALCIUM: 8.7 mg/dL — AB (ref 8.9–10.3)
CO2: 22 mmol/L (ref 22–32)
Chloride: 107 mmol/L (ref 101–111)
Creatinine, Ser: 0.68 mg/dL (ref 0.44–1.00)
GFR calc Af Amer: >60 mL/min (ref >60)
GFR calc non Af Amer: >60 mL/min (ref >60)
GLUCOSE: 117 mg/dL — AB (ref 65–99)
Potassium: 3.3 mmol/L — ABNORMAL LOW (ref 3.5–5.1)
Sodium: 137 mmol/L (ref 135–145)
TOTAL PROTEIN: 7.9 g/dL (ref 6.5–8.1)

## 2014-12-23 LAB — RAPID URINE DRUG SCREEN, HOSP PERFORMED
AMPHETAMINES: NOT DETECTED
Barbiturates: NOT DETECTED
Benzodiazepines: NOT DETECTED
COCAINE: POSITIVE — AB
OPIATES: NOT DETECTED
TETRAHYDROCANNABINOL: NOT DETECTED

## 2014-12-23 LAB — LIPID PANEL
Cholesterol: 174 mg/dL (ref 0–200)
HDL: 74 mg/dL (ref >40)
LDL CALC: 70 mg/dL (ref 0–99)
Total CHOL/HDL Ratio: 2.4 RATIO
Triglycerides: 152 mg/dL — ABNORMAL HIGH (ref <150)
VLDL: 30 mg/dL (ref 0–40)

## 2014-12-23 LAB — URINALYSIS, ROUTINE W REFLEX MICROSCOPIC
Bilirubin Urine: NEGATIVE
GLUCOSE, UA: NEGATIVE mg/dL
HGB URINE DIPSTICK: NEGATIVE
Ketones, ur: NEGATIVE mg/dL
Nitrite: NEGATIVE
PH: 5.5 (ref 5.0–8.0)
Protein, ur: NEGATIVE mg/dL
SPECIFIC GRAVITY, URINE: 1.025 (ref 1.005–1.030)
Urobilinogen, UA: 0.2 mg/dL (ref 0.0–1.0)

## 2014-12-23 LAB — PROTIME-INR
INR: 1.12 (ref 0.00–1.49)
Prothrombin Time: 14.6 seconds (ref 11.6–15.2)

## 2014-12-23 LAB — I-STAT TROPONIN, ED: Troponin i, poc: 0 ng/mL (ref 0.00–0.08)

## 2014-12-23 LAB — ETHANOL: Alcohol, Ethyl (B): 108 mg/dL — ABNORMAL HIGH (ref <5)

## 2014-12-23 LAB — APTT: aPTT: 32 seconds (ref 24–37)

## 2014-12-23 LAB — URINE MICROSCOPIC-ADD ON

## 2014-12-23 MED ORDER — LORAZEPAM 1 MG PO TABS
0.0000 mg | ORAL_TABLET | Freq: Two times a day (BID) | ORAL | Status: DC
Start: 2014-12-25 — End: 2014-12-23

## 2014-12-23 MED ORDER — LORAZEPAM 1 MG PO TABS: 1.0000 mg | ORAL_TABLET | Freq: Four times a day (QID) | ORAL | Status: DC | PRN

## 2014-12-23 MED ORDER — BUDESONIDE-FORMOTEROL FUMARATE 160-4.5 MCG/ACT IN AERO
2.0000 | INHALATION_SPRAY | Freq: Two times a day (BID) | RESPIRATORY_TRACT | Status: DC
  Administered 2014-12-23: 2 via RESPIRATORY_TRACT
  Filled 2014-12-23: qty 6

## 2014-12-23 MED ORDER — VITAMIN B-1 100 MG PO TABS
100.0000 mg | ORAL_TABLET | Freq: Every day | ORAL | Status: DC
  Administered 2014-12-23: 100 mg via ORAL
  Filled 2014-12-23: qty 1

## 2014-12-23 MED ORDER — ASPIRIN 81 MG PO TABS: 81.0000 mg | ORAL_TABLET | Freq: Every day | ORAL | Status: DC

## 2014-12-23 MED ORDER — ASPIRIN 325 MG PO TABS
325.0000 mg | ORAL_TABLET | Freq: Every day | ORAL | Status: DC
  Administered 2014-12-23: 325 mg via ORAL
  Filled 2014-12-23: qty 1

## 2014-12-23 MED ORDER — LORAZEPAM 1 MG PO TABS
0.0000 mg | ORAL_TABLET | Freq: Four times a day (QID) | ORAL | Status: DC
  Administered 2014-12-23: 1 mg via ORAL
  Filled 2014-12-23: qty 1

## 2014-12-23 MED ORDER — FOLIC ACID 1 MG PO TABS: 1.0000 mg | ORAL_TABLET | Freq: Every day | ORAL | Status: DC

## 2014-12-23 MED ORDER — ATORVASTATIN CALCIUM 20 MG PO TABS
20.0000 mg | ORAL_TABLET | Freq: Every day | ORAL | Status: DC
  Administered 2014-12-23: 20 mg via ORAL
  Filled 2014-12-23: qty 1

## 2014-12-23 MED ORDER — FOLIC ACID 1 MG PO TABS
1.0000 mg | ORAL_TABLET | Freq: Every day | ORAL | Status: DC
  Administered 2014-12-23: 1 mg via ORAL
  Filled 2014-12-23: qty 1

## 2014-12-23 MED ORDER — STROKE: EARLY STAGES OF RECOVERY BOOK
Freq: Once | Status: AC
  Administered 2014-12-23: 14:00:00
  Filled 2014-12-23: qty 1

## 2014-12-23 MED ORDER — ATORVASTATIN CALCIUM 10 MG PO TABS: 10.0000 mg | ORAL_TABLET | Freq: Every day | ORAL | Status: DC

## 2014-12-23 MED ORDER — ALBUTEROL SULFATE HFA 108 (90 BASE) MCG/ACT IN AERS: 1.0000 | INHALATION_SPRAY | RESPIRATORY_TRACT | Status: DC | PRN

## 2014-12-23 MED ORDER — LORAZEPAM 2 MG/ML IJ SOLN: 1.0000 mg | Freq: Four times a day (QID) | INTRAMUSCULAR | Status: DC | PRN

## 2014-12-23 MED ORDER — THIAMINE HCL 100 MG PO TABS: 100.0000 mg | ORAL_TABLET | Freq: Every day | ORAL | Status: DC

## 2014-12-23 MED ORDER — ASPIRIN 300 MG RE SUPP
300.0000 mg | Freq: Every day | RECTAL | Status: DC
  Filled 2014-12-23 (×3): qty 1

## 2014-12-23 MED ORDER — ADULT MULTIVITAMIN W/MINERALS CH
1.0000 | ORAL_TABLET | Freq: Every day | ORAL | Status: DC
  Administered 2014-12-23: 1 via ORAL
  Filled 2014-12-23: qty 1

## 2014-12-23 MED ORDER — ALBUTEROL SULFATE (2.5 MG/3ML) 0.083% IN NEBU: 3.0000 mL | INHALATION_SOLUTION | Freq: Four times a day (QID) | RESPIRATORY_TRACT | Status: DC | PRN

## 2014-12-23 MED ORDER — ENOXAPARIN SODIUM 40 MG/0.4ML ~~LOC~~ SOLN
40.0000 mg | SUBCUTANEOUS | Status: DC
  Administered 2014-12-23: 40 mg via SUBCUTANEOUS
  Filled 2014-12-23: qty 0.4

## 2014-12-23 MED ORDER — POTASSIUM CHLORIDE CRYS ER 20 MEQ PO TBCR
40.0000 meq | EXTENDED_RELEASE_TABLET | Freq: Once | ORAL | Status: AC
  Administered 2014-12-23: 40 meq via ORAL
  Filled 2014-12-23: qty 2

## 2014-12-23 MED ORDER — SENNOSIDES-DOCUSATE SODIUM 8.6-50 MG PO TABS: 1.0000 | ORAL_TABLET | Freq: Every evening | ORAL | Status: DC | PRN

## 2014-12-23 MED ORDER — AMLODIPINE BESYLATE 5 MG PO TABS
2.5000 mg | ORAL_TABLET | Freq: Every day | ORAL | Status: DC
  Administered 2014-12-23: 2.5 mg via ORAL
  Filled 2014-12-23: qty 1

## 2014-12-23 MED ORDER — THIAMINE HCL 100 MG/ML IJ SOLN: 100.0000 mg | Freq: Every day | INTRAMUSCULAR | Status: DC

## 2014-12-23 MED ORDER — ADULT MULTIVITAMIN W/MINERALS CH: 1.0000 | ORAL_TABLET | Freq: Every day | ORAL | Status: DC

## 2014-12-23 NOTE — Care Management Note (Signed)
Case Management Note  Patient Details  Name: Yvonne Clay MRN: [id number] Date of Birth: 04-02-1956  Expected Discharge Date:  12/25/14               Expected Discharge Plan:  Home/Self Care  In-House Referral:     Discharge planning Services  CM Consult  Post Acute Care Choice:  Durable Medical Equipment Choice offered to:  Patient  DME Arranged:  Dan Humphreys rolling DME Agency:  [facility]  HH Arranged:    [facility] Agency:     Status of Service:  Completed, signed off  Medicare Important Message Given:    Date Medicare IM Given:    Medicare IM give by:    Date Additional Medicare IM Given:    Additional Medicare Important Message give by:     If discussed at Long Length of Stay Meetings, dates discussed:    Additional Comments: Pt is from home, lives alone and is independent with ADL's. Pt admitted for CVA. PT has recommended RW and OP PT. Pt has chosen [facility] for DME needs. Alroy Bailiff, of [facility], made aware of referral and will obtain pt info from chart and have RW delivered to pt's room prior to DC. Pt has requested OP PT be referred to [facility] in [place]. Referral form signed by MD and faxed. No further CM needs at this time. Discharge anticipated later today.  Malcolm Metro, RN 12/23/2014, 1:34 PM

## 2014-12-23 NOTE — H&P (Signed)
[facility] History and Physical  [name] WUJ:[id number] DOB: 1955/07/08    PCP:   Syliva Overman, MD   Chief Complaint:  Numbness on the right side, dropping objects on the right hand, and leaning to the right side.   HPI: Yvonne Clay is an 59 y.o. female with hx of polysubstance abuse, including significant daily alcohol use (One bottle of wine daily), COPD, depression, HTN, hx of hep C, presented to the ER with right arm and leg numbness, dropping objects from her hands several times, and right facial numbness for the past 3 days.  Her neighbor had urged her to come to the hospital.  She denied HA, slurred speech, nausea, vomiting, vertigo or visual prlblems.  Evalaution in the ER showed alcohol level of  100, CT of the head was negative, and serology was unremarkable (K of 3.3), and EKG showed NSR.  Hospitalist was asked to admit her for CVA work up.   Rewiew of Systems:  Constitutional: Negative for malaise, fever and chills. No significant weight loss or weight gain Eyes: Negative for eye pain, redness and discharge, diplopia, visual changes, or flashes of light. ENMT: Negative for ear pain, hoarseness, nasal congestion, sinus pressure and sore throat. No headaches; tinnitus, drooling, or problem swallowing. Cardiovascular: Negative for chest pain, palpitations, diaphoresis, dyspnea and peripheral edema. ; No orthopnea, PND Respiratory: Negative for cough, hemoptysis, wheezing and stridor. No pleuritic chestpain. Gastrointestinal: Negative for nausea, vomiting, diarrhea, constipation, abdominal pain, melena, blood in stool, hematemesis, jaundice and rectal bleeding.    Genitourinary: Negative for frequency, dysuria, incontinence,flank pain and hematuria; Musculoskeletal: Negative for back pain and neck pain. Negative for swelling and trauma.;  Skin: . Negative for pruritus, rash, abrasions, bruising and skin lesion.; ulcerations Neuro: Negative for headache,  lightheadedness and neck stiffness. Negative for   altered level of consciousness , altered mental status, extremity weakness, burning feet, involuntary movement, seizure and syncope.  Psych: negative for anxiety, depression, insomnia, tearfulness, panic attacks, hallucinations, paranoia, suicidal or homicidal ideation    Past Medical History  Diagnosis Date  . Substance abuse   . Depression   . COPD (chronic obstructive pulmonary disease)   . Arthritis   . Hepatitis C dx in 2011 by dentist  . Hypertension     Past Surgical History  Procedure Laterality Date  . Abdominal hysterectomy    . Fracture surgery      right leg     Medications:  HOME MEDS: Prior to Admission medications   Medication Sig Start Date End Date Taking? Authorizing Provider  albuterol (PROVENTIL HFA) 108 (90 BASE) MCG/ACT inhaler Every 6-8 hours as needed Patient taking differently: Inhale 1-2 puffs into the lungs every 6 (six) hours as needed for wheezing or shortness of breath. Every 6-8 hours as needed 09/03/14  Yes Kerri Perches, MD  amLODipine (NORVASC) 2.5 MG tablet Take 1 tablet (2.5 mg total) by mouth daily. 09/03/14 09/03/15 Yes Kerri Perches, MD  ASPIRIN EC PO Take 2 tablets by mouth once as needed.   Yes Historical Provider, MD  benzonatate (TESSALON PERLES) 100 MG capsule Take 1 capsule (100 mg total) by mouth 2 (two) times daily. Patient not taking: Reported on 12/22/2014 12/18/14 12/18/15  Kerri Perches, MD  budesonide-formoterol [facility]) 160-4.5 MCG/ACT inhaler Inhale 2 puffs into the lungs 2 (two) times daily. 10/22/14   Kerri Perches, MD  cyclobenzaprine (FLEXERIL) 10 MG tablet Take 1 tablet (10 mg total) by mouth at bedtime as needed  for muscle spasms. Take one tablet by mouth at bedtime as needed 09/03/14   Kerri Perches, MD  FLUoxetine (PROZAC) 10 MG capsule Take 1 capsule (10 mg total) by mouth daily. Patient not taking: Reported on 12/22/2014 09/03/14   Kerri Perches, MD   ibuprofen (ADVIL,MOTRIN) 800 MG tablet Take 1 tablet (800 mg total) by mouth 2 (two) times daily as needed. 10/21/14   Kerri Perches, MD  levofloxacin (LEVAQUIN) 500 MG tablet Take 1 tablet (500 mg total) by mouth daily. Patient not taking: Reported on 12/22/2014 12/18/14   Kerri Perches, MD  oxyCODONE-acetaminophen (PERCOCET/ROXICET) 5-325 MG per tablet TAKE ONE TABLET BY MOUTH ONCE DAILY 11/05/14   Historical Provider, MD  temazepam (RESTORIL) 15 MG capsule Take 1 capsule (15 mg total) by mouth at bedtime as needed. Patient taking differently: Take 15 mg by mouth at bedtime as needed for sleep.  09/03/14   Kerri Perches, MD  Vitamin D, Ergocalciferol, (DRISDOL) 50000 UNITS CAPS capsule Take 1 capsule (50,000 Units total) by mouth every 7 (seven) days. 09/12/14   Kerri Perches, MD     Allergies:  Allergies  Allergen Reactions  . Penicillins     Social History:   reports that she has been smoking.  She does not have any smokeless tobacco history on file. She reports that she drinks about 7.2 oz of alcohol per week. She reports that she does not use illicit drugs.  Family History: Family History  Problem Relation Age of Onset  . Family history unknown: Yes     Physical Exam: Filed Vitals:   12/22/14 2240 12/22/14 2241 12/22/14 2250 12/23/14 0337  BP: 129/90 129/90  123/69  Pulse: 95 93  88  Temp: 97.9 F (36.6 C) 97.9 F (36.6 C)    TempSrc: Oral Oral    Resp: Height: 5' 4.5" (1.638 m)     Weight: 51.71 kg (114 lb)     SpO2: 100% 100% 95% 97%   Blood pressure 123/69, pulse 88, temperature 97.9 F (36.6 C), temperature source Oral, resp. rate 18, height 5' 4.5" (1.638 m), weight 51.71 kg (114 lb), SpO2 97 %.  GEN:  Pleasant  patient lying in the stretcher in no acute distress; cooperative with exam. PSYCH:  alert and oriented x4; does not appear anxious or depressed; affect is appropriate. HEENT: Mucous membranes pink and anicteric; PERRLA; EOM  intact; no cervical lymphadenopathy nor thyromegaly or carotid bruit; no JVD; There were no stridor. Neck is very supple. Breasts:: Not examined CHEST WALL: No tenderness CHEST: Normal respiration, clear to auscultation bilaterally.  HEART: Regular rate and rhythm.  There are no murmur, rub, or gallops.   BACK: No kyphosis or scoliosis; no CVA tenderness ABDOMEN: soft and non-tender; no masses, no organomegaly, normal abdominal bowel sounds; no pannus; no intertriginous candida. There is no rebound and no distention. Rectal Exam: Not done EXTREMITIES: No bone or joint deformity; age-appropriate arthropathy of the hands and knees; no edema; no ulcerations.  There is no calf tenderness. Genitalia: not examined PULSES: 2+ and symmetric SKIN: Normal hydration no rash or ulceration CNS: Cranial nerves 2-12 grossly intact no focal lateralizing neurologic deficit.  Speech is fluent; uvula elevated with phonation, facial symmetry and tongue midline. DTR are normal bilaterally, cerebella exam is intact, barbinski is negative and strengths are equaled bilaterally.  Decrease sensation on right side.     Labs on Admission:  Basic Metabolic Panel:  Recent Labs Lab  12/22/14 2342  NA 137  K 3.3*  CL 107  CO2 22  GLUCOSE 117*  BUN 10  CREATININE 0.68  CALCIUM 8.7*   Liver Function Tests:  Recent Labs Lab 12/22/14 2342  AST 46*  ALT 38  ALKPHOS 104  BILITOT 0.2*  PROT 7.9  ALBUMIN 3.7   No results for input(s): LIPASE, AMYLASE in the last 168 hours. No results for input(s): AMMONIA in the last 168 hours. CBC:  Recent Labs Lab 12/22/14 2342  WBC 5.7  NEUTROABS 2.0  HGB 12.8  HCT 37.7  MCV 99.2  PLT 211    Radiological Exams on Admission: Ct Head Wo Contrast  12/23/2014   CLINICAL DATA:  Right hand numbness and sharp intermittent pain radiating to the right leg. Pain for 1 week. Cough, chills, and congestion for 1 week.  EXAM: CT HEAD WITHOUT CONTRAST  TECHNIQUE: Contiguous  axial images were obtained from the base of the skull through the vertex without intravenous contrast.  COMPARISON:  10/17/2014  FINDINGS: Ventricles and sulci appear symmetrical. No ventricular dilatation. No mass effect or midline shift. No abnormal extra-axial fluid collections. Gray-white matter junctions are distinct. Basal cisterns are not effaced. No evidence of acute intracranial hemorrhage. No depressed skull fractures. Mucosal thickening in the paranasal sinuses with opacification of the right ethmoid air cell. Mastoid air cells are not opacified. Vascular calcifications.  IMPRESSION: No acute intracranial abnormalities. Mucosal thickening in the paranasal sinuses, likely inflammatory.   Electronically Signed   By: Burman Nieves M.D.   On: 12/23/2014 01:27    EKG: Independently reviewed.    Assessment/Plan Present on Admission:  . COPD (chronic obstructive pulmonary disease) . Acute hepatitis C virus infection . TOBACCO USER . Alcohol dependence . Alcohol intoxication . CVA (cerebral infarction)  PLAN:  I suspect she had a left hemispheric CVA about three days ago, since her ictus was at that time.  Will give ASA, and admit her for work up.  Will obtain cardiac echo, carotid doppler, MRI/MRA of the brain.  She was advised to quit smoking and stop alcohol.  Will give CIWA with scheduled ativan, along with folate, MRI, and thiamine.  For her asthma, will continue with her meds.  She is stable, full code, and will be admitted to [facility] service.  Thank you and Good day.   Other plans as per orders.  Code Status: FULL Unk Lightning, MD. [facility] Pager [phone] 7pm to 7am.  12/23/2014, 3:52 AM

## 2014-12-23 NOTE — ED Notes (Signed)
Pt requested that IV be removed. Stated she would do it herself if we did not do it for her. IV removed per her request.

## 2014-12-23 NOTE — Discharge Summary (Signed)
Physician Discharge Summary  Yvonne Clay:[id number] DOB: [birth year] DOA: 12/22/2014  PCP: Yvonne Overman, MD  Admit date: 12/22/2014 Discharge date: 12/23/2014  Recommendations for Outpatient Follow-up:   Recommend follow-up with neurology as an outpatient for stroke. Started on daily aspirin (was not taking consistently at home) and statin.   Follow up with outpatient PT. No ST needs. Consider outpatient OT evaluation, could not be completed while inpatient.  Continue to encourage smoking cessation, alcohol cessation and discontinuation of crack use   Follow-up Information    Follow up with Yvonne Overman, MD On 12/30/2014.   Specialty:  Family Medicine   Why:  at 1:45 pm   Contact information:   [address] [place] [address] [phone]       Follow up with Yvonne Beams, MD On 01/07/2015.   Specialty:  Neurology   Why:  at 11:00 am   Contact information:   [address] [place] [address] [phone]        Discharge Diagnoses:  1. Acute nonhemorrhagic left pontine infarct 2. Alcohol dependence/alcohol intoxication on admission 3. COPD.  4. HTN 5. Tobacco user 6. Ongoing crack use  Discharge Condition: Improved  Disposition: Discharge home  Diet recommendation: Regular   Filed Weights   12/22/14 2240  Weight: 51.71 kg (114 lb)    History of present illness:  60 yof with hx of polysubstance abuse, including significant daily alcohol use, COPD, depression, HTN, hx of hep C, presented to the ER with right arm and leg numbness, dropping objects from her hands several times, and right facial numbness for the past 3 days.Evalaution in the ER showed alcohol level 108, CT of the head negative, serology unremarkable, and EKG showed NSR. Admitted for CVA work up.   Hospital Course:  MRI brain revealed nonhemorrhagic left pontine infarct for which tPA was not provided due to duration of symptoms/delay in presentation to  hospital. Not on ASA consistently at home. Evaluation with carotids was unremarkable as was MRA brain. Urine drug screen was positive for cocaine. Physical therapy has evaluated and recommends outpatient PT. Upon admission her alcohol level was 108. She expressed no interest in any type of smoking or drinking cessation. All other imaging and labs were unremarkable. Recommend outpatient f/u for risk factor reduction (HTN, alcohol abuse).    Individual issues as below:  1. Acute nonhemorrhagic left pontine infarct, revealed on MRI Head. Symptoms since 8/19 therefore not a candidate for tPA. CT head NAD. LDL 70. Carotid U/S no significant stenosis. MRA Head unremarkable. Right face/UE/LE numbness, right UE clumsiness with 4/5 RLE weakness. 2. Equivocal U/A. F/u culture.  3. COPD stable, exacerbated due #6. Expressed no interest in quitting.  4. HTN, stable 5. Alcohol dependence/alchol intoxication. Upon admission blood toxic revealed alcohol level of 108. No evidence of withdrawal. 6. Tobacco user. Counseled on cessation  7. Intermittent crack usage. UDS ordered.   Consultants:  PT  Procedures:  Echo Study Conclusions  - Left ventricle: The cavity size was normal. Wall thickness was normal. Systolic function was vigorous. The estimated ejection fraction was in the range of 65% to 70%. Wall motion was normal; there were no regional wall motion abnormalities. The study is not technically sufficient to allow evaluation of LV diastolic function. - Right atrium: Central venous pressure (est): 3 mm Hg. - Atrial septum: No defect or patent foramen ovale was identified. - Tricuspid valve: There was mild regurgitation. - Pulmonary arteries: PA peak pressure: 26 mm  Hg (S). - Pericardium, extracardiac: There was no pericardial effusion.  Impressions:  - Normal LV wall thickness with LVEF 65-70%, indeterminate diastolic function. Mild tricuspid regurgitation with PASP 26 mmHg.  No PFO or ASD.  Discharge Instructions Discharge Instructions    Diet - low sodium heart healthy    Complete by:  As directed      Discharge instructions    Complete by:  As directed   Call your physician or seek immediate medical attention for weakness, worsened numbness, difficulty speaking or swallowing. Please stop smoking, stop crack, stop alcohol use under supervision of your doctor.     Increase activity slowly    Complete by:  As directed             Current Discharge Medication List    START taking these medications   Details  aspirin 81 MG tablet Take 1 tablet (81 mg total) by mouth daily.    atorvastatin (LIPITOR) 10 MG tablet Take 1 tablet (10 mg total) by mouth daily at 6 PM. Qty: 30 tablet, Refills: 0    folic acid (FOLVITE) 1 MG tablet Take 1 tablet (1 mg total) by mouth daily.    Multiple Vitamin (MULTIVITAMIN WITH MINERALS) TABS tablet Take 1 tablet by mouth daily.    thiamine 100 MG tablet Take 1 tablet (100 mg total) by mouth daily.      CONTINUE these medications which have CHANGED   Details  albuterol (PROVENTIL HFA) 108 (90 BASE) MCG/ACT inhaler Inhale 1-2 puffs into the lungs every 4 (four) hours as needed for wheezing or shortness of breath. Every 6-8 hours as needed Qty: 6.7 g, Refills: 0      CONTINUE these medications which have NOT CHANGED   Details  amLODipine (NORVASC) 2.5 MG tablet Take 1 tablet (2.5 mg total) by mouth daily. Qty: 30 tablet, Refills: 3   Associated Diagnoses: Essential hypertension    budesonide-formoterol (SYMBICORT) 160-4.5 MCG/ACT inhaler Inhale 2 puffs into the lungs 2 (two) times daily. Qty: 1 Inhaler, Refills: 3   Associated Diagnoses: Intrinsic asthma, unspecified asthma severity, uncomplicated    cyclobenzaprine (FLEXERIL) 10 MG tablet Take 1 tablet (10 mg total) by mouth at bedtime as needed for muscle spasms. Take one tablet by mouth at bedtime as needed Qty: 30 tablet, Refills: 3   Associated Diagnoses:  Muscle spasms of head and/or neck    oxyCODONE-acetaminophen (PERCOCET/ROXICET) 5-325 MG per tablet TAKE ONE TABLET BY MOUTH ONCE DAILY Refills: 0    temazepam (RESTORIL) 15 MG capsule Take 1 capsule (15 mg total) by mouth at bedtime as needed. Qty: 30 capsule, Refills: 3    Vitamin D, Ergocalciferol, (DRISDOL) 50000 UNITS CAPS capsule Take 1 capsule (50,000 Units total) by mouth every 7 (seven) days. Qty: 4 capsule, Refills: 5      STOP taking these medications     aspirin EC 81 MG tablet        Allergies  Allergen Reactions  . Penicillins     The results of significant diagnostics from this hospitalization (including imaging, microbiology, ancillary and laboratory) are listed below for reference.    Significant Diagnostic Studies: Ct Head Wo Contrast  12/23/2014   CLINICAL DATA:  Right hand numbness and sharp intermittent pain radiating to the right leg. Pain for 1 week. Cough, chills, and congestion for 1 week.  EXAM: CT HEAD WITHOUT CONTRAST  TECHNIQUE: Contiguous axial images were obtained from the base of the skull through the vertex without intravenous contrast.  COMPARISON:  10/17/2014  FINDINGS: Ventricles and sulci appear symmetrical. No ventricular dilatation. No mass effect or midline shift. No abnormal extra-axial fluid collections. Gray-white matter junctions are distinct. Basal cisterns are not effaced. No evidence of acute intracranial hemorrhage. No depressed skull fractures. Mucosal thickening in the paranasal sinuses with opacification of the right ethmoid air cell. Mastoid air cells are not opacified. Vascular calcifications.  IMPRESSION: No acute intracranial abnormalities. Mucosal thickening in the paranasal sinuses, likely inflammatory.   Electronically Signed   By: Burman Nieves M.D.   On: 12/23/2014 01:27   Mr Maxine Glenn Head Wo Contrast  12/23/2014   CLINICAL DATA:  Right-sided numbness.  EXAM: MRI HEAD WITHOUT CONTRAST  MRA HEAD WITHOUT CONTRAST  TECHNIQUE:  Multiplanar, multiecho pulse sequences of the brain and surrounding structures were obtained without intravenous contrast. Angiographic images of the head were obtained using MRA technique without contrast.  COMPARISON:  Head CT from earlier the same day  FINDINGS: MRI HEAD FINDINGS  Motion degraded study which could obscure pathology.  Calvarium and upper cervical spine: Heterogeneous calvarial marrow without focal mass or aggressive process on prior head CT.  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  Orbits: No significant findings.  Sinuses and Mastoids: Clear. Mastoid and middle ears are clear.  Brain: Restricted diffusion in the posterior left pons, extending to the fourth ventricular floor, respecting the midline. No associated hemorrhage. No other acute infarct. Normal flow related signal loss in the major intracranial vessels.  Abnormal patchy T2 and FLAIR hyperintense foci in the bilateral cerebral white matter consistent with chronic small vessel disease, mild.  No evidence of mass lesion, hemorrhage, or hydrocephalus.  MRA HEAD FINDINGS  Motion, which results in artifact especially at slab interface and upper scan.  Slight left vertebral artery dominance. There are symmetric and normal appearing PICA and superior cerebellar arteries. When accounting for motion artifact, no suspected PCA stenosis or narrowing. Normal appearance of the basilar with no indication of dissection or ulcerated plaque to explain the acute infarct.  Symmetric carotid arteries with no stenosis. No anterior communicating or definite posterior communicating arteries visualized. A1 and M1 segments are widely patent. Artifact at the level of the M2 vessels which limits evaluation. Apparent smooth narrowing of bilateral M3 branches is questionable due to motion.  No evidence of aneurysm or vascular malformation.  IMPRESSION: Acute nonhemorrhagic left pontine infarct. No explanatory pathology on motion degraded MRA.    Electronically Signed   By: Marnee Spring M.D.   On: 12/23/2014 09:16   Mr Brain Wo Contrast  12/23/2014   CLINICAL DATA:  Right-sided numbness.  EXAM: MRI HEAD WITHOUT CONTRAST  MRA HEAD WITHOUT CONTRAST  TECHNIQUE: Multiplanar, multiecho pulse sequences of the brain and surrounding structures were obtained without intravenous contrast. Angiographic images of the head were obtained using MRA technique without contrast.  COMPARISON:  Head CT from earlier the same day  FINDINGS: MRI HEAD FINDINGS  Motion degraded study which could obscure pathology.  Calvarium and upper cervical spine: Heterogeneous calvarial marrow without focal mass or aggressive process on prior head CT.  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  Orbits: No significant findings.  Sinuses and Mastoids: Clear. Mastoid and middle ears are clear.  Brain: Restricted diffusion in the posterior left pons, extending to the fourth ventricular floor, respecting the midline. No associated hemorrhage. No other acute infarct. Normal flow related signal loss in the major intracranial vessels.  Abnormal patchy T2 and FLAIR  hyperintense foci in the bilateral cerebral white matter consistent with chronic small vessel disease, mild.  No evidence of mass lesion, hemorrhage, or hydrocephalus.  MRA HEAD FINDINGS  Motion, which results in artifact especially at slab interface and upper scan.  Slight left vertebral artery dominance. There are symmetric and normal appearing PICA and superior cerebellar arteries. When accounting for motion artifact, no suspected PCA stenosis or narrowing. Normal appearance of the basilar with no indication of dissection or ulcerated plaque to explain the acute infarct.  Symmetric carotid arteries with no stenosis. No anterior communicating or definite posterior communicating arteries visualized. A1 and M1 segments are widely patent. Artifact at the level of the M2 vessels which limits evaluation. Apparent smooth  narrowing of bilateral M3 branches is questionable due to motion.  No evidence of aneurysm or vascular malformation.  IMPRESSION: Acute nonhemorrhagic left pontine infarct. No explanatory pathology on motion degraded MRA.   Electronically Signed   By: Marnee Spring M.D.   On: 12/23/2014 09:16   US Carotid Bilateral  12/23/2014   CLINICAL DATA:  Left pontine infarct on MRI.  EXAM: BILATERAL CAROTID DUPLEX ULTRASOUND  TECHNIQUE: Wallace Cullens scale imaging, color Doppler and duplex ultrasound were performed of bilateral carotid and vertebral arteries in the neck.  COMPARISON:  MRI 12/23/2014 and CT 12/23/2014  FINDINGS: Criteria: Quantification of carotid stenosis is based on velocity parameters that correlate the residual internal carotid diameter with NASCET-based stenosis levels, using the diameter of the distal internal carotid lumen as the denominator for stenosis measurement.  The following velocity measurements were obtained:  RIGHT  ICA:  78 cm/sec  CCA:  82 cm/sec  SYSTOLIC ICA/CCA RATIO:  1.0  DIASTOLIC ICA/CCA RATIO:  2.0  ECA:  41 cm/sec  LEFT  ICA:  64 cm/sec  CCA:  77 cm/sec  SYSTOLIC ICA/CCA RATIO:  0.8  DIASTOLIC ICA/CCA RATIO:  1.3  ECA:  63 cm/sec  RIGHT CAROTID ARTERY: Minimal intimal thickening of the common carotid artery. Mild noncalcified plaque at the right carotid bulb. Normal Doppler waveforms a demonstrated.  RIGHT VERTEBRAL ARTERY:  Normal antegrade flow.  LEFT CAROTID ARTERY: Minimal intimal thickening of the common carotid artery. Mild calcified plaque at the left carotid bulb and proximal internal carotid artery. Normal Doppler waveforms are demonstrated.  LEFT VERTEBRAL ARTERY:  Normal antegrade flow.  IMPRESSION: Minimal calcified plaque at the carotid bulb bilaterally and proximal left internal carotid artery. No hemodynamically significant stenosis.  Normal antegrade flow within the vertebral arteries.   Electronically Signed   By: Elberta Fortis M.D.   On: 12/23/2014 10:31    Labs: Basic Metabolic Panel:  Recent Labs Lab 12/22/14 2342  NA 137  K 3.3*  CL 107  CO2 22  GLUCOSE 117*  BUN 10  CREATININE 0.68  CALCIUM 8.7*   Liver Function Tests:  Recent Labs Lab 12/22/14 2342  AST 46*  ALT 38  ALKPHOS 104  BILITOT 0.2*  PROT 7.9  ALBUMIN 3.7  CBC:  Recent Labs Lab 12/22/14 2342  WBC 5.7  NEUTROABS 2.0  HGB 12.8  HCT 37.7  MCV 99.2  PLT 211   Principal Problem:   Left pontine stroke Active Problems:   TOBACCO USER   COPD (chronic obstructive pulmonary disease)   HTN (hypertension)   Alcohol dependence   Alcohol intoxication   CVA (cerebral infarction)   HCV (hepatitis C virus)   Time coordinating discharge: 35 minutes   Signed:  Brendia Sacks, MD [facility] 12/23/2014, 12:48 PM  Greggory Keen Jari Pigg, acting as scribe, recorded this note contemporaneously in the presence of Dr. Melton Alar. Irene Limbo, M.D. on 12/23/2014 .  I have reviewed the above documentation for accuracy and completeness, and I agree with the above. Brendia Sacks, MD

## 2014-12-23 NOTE — ED Notes (Addendum)
Spoke with Dr Conley Rolls about pt IV and CVA admission. Pt has refused IV placement. Per Dr. Conley Rolls, Iv to remain out if pt refuses, and pt to be treated and documented for inpatient CVA, not a code stroke at this time. Pt has stated multiple times that she does not want to stay, EDP and hospitalist aware. Report given to floor. Updated on pt condition and refusals while in ED.

## 2014-12-23 NOTE — Progress Notes (Signed)
Patient discharging home. Reviewed medications with patient, patient verbalized understanding. Explained the importance of taking Aspirin and Lipitor daily to prevent another stroke, patient verbalized understanding. Patient advised to stop drinking, smoking, and using drugs.  Rosine Door RN

## 2014-12-23 NOTE — Clinical Social Work Note (Signed)
Clinical Social Work Assessment  Patient Details  Name: Yvonne Clay MRN: [id number] Date of Birth: 27-Jul-1955  Date of referral:  12/23/14               Reason for consult:  Substance Use/ETOH Abuse                Permission sought to share information with:    Permission granted to share information::     Name::        Agency::     Relationship::     Contact Information:     Housing/Transportation Living arrangements for the past 2 months:  Single Family Home Source of Information:  Patient Patient Interpreter Needed:  None Criminal Activity/Legal Involvement Pertinent to Current Situation/Hospitalization:  No - Comment as needed Significant Relationships:  Other(Comment) (CNA) Lives with:  Self Do you feel safe going back to the place where you live?  Yes Need for family participation in patient care:  No (Coment)  Care giving concerns:  Pt lives at home independently. PT recommending outpatient follow up.    Social Worker assessment / plan:  CSW met with pt at bedside following MD referral for substance use. Pt alert and oriented and reports she came to hospital due to balance issues. Admitted due to CVA. Pt indicates she lives alone and her best support is her CNA (CAP?) who is with pt 7 days a week, 4 hours a day. She reports, "I don't fool with my family." Pt is on disability, but she is unsure why.   CSW addressed substance use with pt. She admits to smoking crack every now and then. When asked how often this is, pt responded, "Once in a blue moon." UDS pending. Pt admits to drinking a bottle of wine daily. She explains that she puts ice in it to dilute it and this is how she also drinks water. Pt states, "I just don't like water." CSW suggested that there were other drinks besides alcohol and water, but pt immediately said, "I just like wine." She shared that her friends provide her wine. Pt does not feel that drinking is a problem for her at all. CSW asked if she was  interested in cutting back or stopping and pt states, "Nope." Pt did not answer several questions on SBIRT, so not completed. She said that her PCP, Dr. Moshe Cipro has tried to sign her up for outpatient treatment, but pt has refused to show up. CSW offered to provide outpatient resources, but pt refused. She said she knew where to go if she wanted to, but has no desire to stop.    Employment status:  Disabled (Comment on whether or not currently receiving Disability) Insurance information:  Medicaid In [place] PT Recommendations:   (outpatient ) Information / Referral to community resources:   (pt did not answer some questions on SBIRT and refused resources)  Patient/Family's Response to care:  Pt answered some questions, but was resistant to others. She refuses any resources.   Patient/Family's Understanding of and Emotional Response to Diagnosis, Current Treatment, and Prognosis:  Pt aware of admission diagnosis. When asked if she is aware of how alcohol may be impacting her health, pt replied, "It's not hurting my health at all." Pt has absolutely no desire to cut back or stop drinking. CSW will sign off.  Emotional Assessment Appearance:  Appears older than stated age Attitude/Demeanor/Rapport:  Avoidant Affect (typically observed):  Irritable Orientation:  Oriented to Self, Oriented to Place, Oriented to  Time, Oriented to Situation Alcohol / Substance use:  Alcohol Use Psych involvement (Current and /or in the community):  No (Comment)  Discharge Needs  Concerns to be addressed:  No discharge needs identified Readmission within the last 30 days:  No Current discharge risk:  Substance Abuse Barriers to Discharge:  Continued Medical Work up   Salome Arnt, [place] 12/23/2014, 1:43 PM [phone]

## 2014-12-23 NOTE — Evaluation (Signed)
Physical Therapy Evaluation Patient Details Name: Yvonne Clay MRN: [id number] DOB: [birth year] Today's Date: 12/23/2014   History of Present Illness  Yvonne Clay is an 59 y.o. female with hx of polysubstance abuse, including significant daily alcohol use (One bottle of wine daily), COPD, depression, HTN, hx of hep C, presented to the ER with right arm and leg numbness, dropping objects from her hands several times, and right facial numbness for the past 3 days. Her neighbor had urged her to come to the hospital. She denied HA, slurred speech, nausea, vomiting, vertigo or visual prlblems. Evalaution in the ER showed alcohol level of 100, CT of the head was negative, and serology was unremarkable (K of 3.3), and EKG showed NSR. 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  Clinical Impression   Pt was seen for evaluation.  She was alert and cooperative, speech mildly slurred.  She states that she occasionally has double vision but not currently.  Pt lives alone but has a CAP aide 4 hours/day, 7 days a week to assist with household activities.  Pt describes having had unstable gait for some time and had actually been scheduled to begin OP PT in June of 2016.  She missed her 1st appt because of transportation issues and did not follow up.  Currently, she c/o numbness/tingling in the entire right side of her body.  Her strength in the RLE is WNL but her dynamic standing balance especially with rotational elements is only fair and she now requires a walker to stabilize gait.  It is difficult to determine whether this gait ataxia is due to alcoholic issues or from this recent stroke.  She states that her gait at this time is "more or less" the same as from before her stroke. In any case, she remains a high fall risk and is agreeable to trying OP PT again.  If she keeps drinking, however, I'm not sure if this will help her to any degree.     Follow Up Recommendations Outpatient PT (appointments  need to be coordinated for when CAP aide is available to transport her)    Equipment Recommendations  Rolling walker with 5" wheels    Recommendations for Other Services   none    Precautions / Restrictions Precautions Precautions: Fall Restrictions Weight Bearing Restrictions: No      Mobility  Bed Mobility Overal bed mobility: Independent                Transfers Overall transfer level: Modified independent Equipment used: None                Ambulation/Gait Ambulation/Gait assistance: Min guard Ambulation Distance (Feet): 60 Feet Assistive device: Rolling walker (2 wheeled) Gait Pattern/deviations: Ataxic;Narrow base of support Gait velocity: pt cued to slow her pace but she was unable to follow through Gait velocity interpretation: >2.62 ft/sec, indicative of independent community ambulator General Gait Details: pt is unable to walk now without assistive device due to gait ataxia...she has difficulty placing the RLE properly when standing...a cane does not provide enough assistance but a walker significantly increased stability.  Stairs Stairs:  (states that she is too tired to climb steps)          Wheelchair Mobility    Modified Rankin (Stroke Patients Only) Modified Rankin (Stroke Patients Only) Pre-Morbid Rankin Score:  (unknown) Modified Rankin: Moderate disability     Balance Overall balance assessment: Needs assistance;History of Falls Sitting-balance support: No upper extremity supported;Feet supported Sitting balance-Leahy  Scale: Good     Standing balance support: No upper extremity supported Standing balance-Leahy Scale: Fair Standing balance comment: balance is at it's worst with rotational elements                             Pertinent Vitals/Pain Pain Assessment: No/denies pain    Home Living Family/patient expects to be discharged to:: Private residence Living Arrangements: Alone Available Help at Discharge:  Personal care attendant;Available PRN/intermittently (CNA there 4 hours/day, 7 days/week) Type of Home: House Home Access: Stairs to enter Entrance Stairs-Rails: Right Entrance Stairs-Number of Steps: 6 Home Layout: One level Home Equipment: None Additional Comments: pt reports having poor balance PTA and Dr. Moshe Cipro was advising that she use a cane for gait and had scheduled OP PT to begin 10-10-14.Marland KitchenMarland KitchenMarland KitchenMarland Kitchenpt states that her CNA could not take her there    Prior Function Level of Independence: Needs assistance   Gait / Transfers Assistance Needed: "furniture walks" by her description and has had multiple falls in the past...has not been using any DME  ADL's / Homemaking Assistance Needed: assist with household tasks        Hand Dominance   Dominant Hand: Right    Extremity/Trunk Assessment               Lower Extremity Assessment: RLE deficits/detail RLE Deficits / Details: pt has had a fx of the right ankle in the past and states that the hardware is loose...this causes her pain at times...her ROM and strength of the ankle are WNL, however    Cervical / Trunk Assessment: Kyphotic  Communication   Communication: No difficulties (mildly slurred speech)  Cognition Arousal/Alertness: Awake/alert Behavior During Therapy: WFL for tasks assessed/performed Overall Cognitive Status: Within Functional Limits for tasks assessed                      General Comments      Exercises        Assessment/Plan    PT Assessment All further PT needs can be met in the next venue of care (to be discharged today)  PT Diagnosis Difficulty walking;Abnormality of gait   PT Problem List Decreased activity tolerance;Decreased balance;Decreased mobility;Decreased knowledge of use of DME;Decreased knowledge of precautions  PT Treatment Interventions     PT Goals (Current goals can be found in the Care Plan section) Acute Rehab PT Goals PT Goal Formulation: All assessment and education  complete, DC therapy    Frequency     Barriers to discharge        Co-evaluation               End of Session Equipment Utilized During Treatment: Gait belt Activity Tolerance: Patient limited by fatigue Patient left: in bed;with call bell/phone within reach;with bed alarm set           Time: 8185-6314 PT Time Calculation (min) (ACUTE ONLY): 35 min   Charges:   PT Evaluation $Initial PT Evaluation Tier I: 1 Procedure     PT G CodesSable Feil  PT 12/23/2014, 12:23 PM [phone]

## 2014-12-23 NOTE — Evaluation (Signed)
Speech Language Pathology Evaluation Patient Details Name: Yvonne Clay MRN: [id number] DOB: 1955-07-16 Today's Date: 12/23/2014 Time: 4098-1191 SLP Time Calculation (min) (ACUTE ONLY): 30 min  Problem List:  Patient Active Problem List   Diagnosis Date Noted  . Alcohol dependence 12/23/2014  . Alcohol intoxication 12/23/2014  . CVA (cerebral infarction) 12/23/2014  . Left pontine stroke 12/23/2014  . HCV (hepatitis C virus) 12/23/2014  . Acute bronchitis 12/18/2014  . Fracture of multiple ribs 10/22/2014  . Medicare annual wellness visit, initial 09/06/2014  . Pain in joint, shoulder region 09/05/2013  . HTN (hypertension) 05/22/2012  . At risk for falls 12/07/2010  . COPD (chronic obstructive pulmonary disease) 09/07/2010  . Acute hepatitis C virus infection 02/20/2010  . Insomnia 01/18/2010  . 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 FATIGUE 10/23/2008  . OTH&UNSPEC ALCOHOL DEPENDENCE UNSPEC DRUNKENNESS 02/18/2008  . COCAINE DEPENDENCE UNSPECIFIED ABUSE 02/18/2008  . TOBACCO USER 09/26/2007  . DEPRESSION 09/26/2007  . NECK PAIN, CHRONIC 09/26/2007  . NECK SPASM 09/26/2007   Past Medical History:  Past Medical History  Diagnosis Date  . Substance abuse   . Depression   . COPD (chronic obstructive pulmonary disease)   . Arthritis   . Hepatitis C dx in 2011 by dentist  . Hypertension   . HCV (hepatitis C virus) 12/23/2014   Past Surgical History:  Past Surgical History  Procedure Laterality Date  . Abdominal hysterectomy    . Fracture surgery      right leg    HPI:  Yvonne Clay is a 59 y.o. female with a PMHx of HTN and significant alcohol use (one bottle daily) who presents to the Emergency Department complaining of right-sided numbness, increasing speech difficulty (slurred speech) onset 2-3 days ago. MRI showed Acute nonhemorrhagic left pontine infarct.    Assessment / Plan / Recommendation Clinical Impression  The pt presents with mild dysarthric speech  following an MRI showing Acute nonhemorrhagic left pontine infarct. The pt was evaluated at bedside by means of the [facility] Cognitive Assessment (MoCA) and achieved a score of 17/30 which is a mild deficit although the pt reported "My thinking has always been slow". SLP questions if this mild cognitive impairment is at baseline and the only impairment following left pontine infarct is the mildly slurred/dysarthric speech. The pt was cooperative during evaluation. No skilled ST follow up is recommended at this time.     SLP Assessment  Patient does not need any further Speech Lanaguage Pathology Services    Follow Up Recommendations       Frequency and Duration        Pertinent Vitals/Pain Pain Assessment: No/denies pain   SLP Goals     SLP Evaluation Prior Functioning  Type of Home: House Available Help at Discharge: Personal care attendant;Available PRN/intermittently   Cognition  Overall Cognitive Status: Within Functional Limits for tasks assessed Arousal/Alertness: Awake/alert Orientation Level: Oriented X4 Memory: Impaired Memory Impairment: Decreased recall of new information (pt Rpts "my thinking has always been slow" Possible baseline) Awareness: Appears intact Behaviors: Poor frustration tolerance (Although tolerated SLE when explanation provided)    Comprehension  Auditory Comprehension Overall Auditory Comprehension: Appears within functional limits for tasks assessed Yes/No Questions: Within Functional Limits Commands: Within Functional Limits    Expression Expression Primary Mode of Expression: Verbal Verbal Expression Overall Verbal Expression: Impaired Initiation: No impairment Repetition: Impaired Level of Impairment: Phrase level (pt's speech mildly slurred) Other Verbal Expression Comments: Pt's speech is mildly slurred Written Expression Dominant Hand: Right  Written Expression: Not tested   Oral / Motor Oral Motor/Sensory Function Overall Oral  Motor/Sensory Function: Appears within functional limits for tasks assessed (Pt reports Rt facial numbness continued) Facial Sensation: Reduced (Pt reports Rt facial numbness continued) Motor Speech Overall Motor Speech: Impaired Articulation: Impaired (slurred; mildly dysarthric; Pt reports has improved) Level of Impairment: Sentence Intelligibility: Intelligible   GO    Keishawn Rajewski H. Romie Levee, CCC-SLP Speech Language Pathologist  Georgetta Haber 12/23/2014, 5:49 PM

## 2014-12-23 NOTE — Progress Notes (Addendum)
PROGRESS NOTE  Yvonne Clay UJW:[id number] DOB: [birth year] DOA: 12/22/2014 PCP: Syliva Overman, MD  Summary: 9 yof with hx of polysubstance abuse, including significant daily alcohol use, COPD, depression, HTN, hx of hep C, presented to the ER with right arm and leg numbness, dropping objects from her hands several times, and right facial numbness for the past 3 days.Evalaution in the ER showed alcohol level 108, CT of the headnegative, serology unremarkabl, and EKG showed NSR. Admitted for CVA work up.   Assessment/Plan: 1. Acute nonhemorrhagic left pointe infract, revealed on MRI Head. Symptoms since 8/19 therefore not a candidate for tPA. CT head NAD. LDL 70. Cardoid U/S no significant stenosis. MRA Head unremarkable. Right face/UE/LE numbness, right UE clumsiness. 2. Equivocal U/A. F/u culture.  3. COPD stable, exacerbated  due #6. Expressed no interest in quitting.  4. HTN, stable 5. Alcohol dependence/alchol intoxication. Upon admission blood toxic revealed alcohol level of 108 6. Tobacco user. Counseled on cessation  7. Chart lists h/o crack use   No new issues, awaiting therapy evaluation and ECHO  Start daily ASA (not taking at home), start statin, Hgb A1C pending  CSW consult for substance abuse  Code Status: Full DVT prophylaxis: Lovenox Family Communication: Discussed with patient who understands and has no concerns at this time. Disposition Plan: Anticipate discharge home today.   Brendia Sacks, MD  [facility]  Pager [phone] If 7PM-7AM, please contact night-coverage at [email or web address], password [email or web address] 12/23/2014, 7:23 AM    Consultants:    Procedures:    Antibiotics:    HPI/Subjective: Numbness on face right arm, hands, and legs with consistent tingling. Complains of being dizzy and feeling off balance when standing. Larey Seat two months ago and is having rib pain from healing rib fracture.  Left side is normal.  Objective: Filed Vitals:    12/23/14 0400 12/23/14 0430 12/23/14 0500 12/23/14 0700  BP: 115/73 116/73 114/71 141/80  Pulse: 77 70 74   Temp:   98 F (36.7 C) 98.3 F (36.8 C)  TempSrc:   Oral Oral  Resp: Height:      Weight:      SpO2: 98% 98% 99% 100%   No intake or output data in the 24 hours ending 12/23/14 0723   Filed Weights   12/22/14 2240  Weight: 51.71 kg (114 lb)    Exam:  Afebrile. General:  Appears calm and comfortable Eyes: Right round and reactive to light, left pupil dilated, minimally reactive to light,  Eccentric, suspect dense cataract  ENT: grossly normal hearing, lips & tongue Neck: no LAD, masses or thyromegaly Cardiovascular: RRR, no m/r/g. No LE edema. Telemetry: SR  Respiratory: CTA bilaterally, no w/r/r. Normal respiratory effort. Abdomen: soft, ntnd Skin: no rash or induration noted. Musculoskeletal: grossly normal tone.. Grossly normal strength RUE 5/5 & LUE 5/5. LLE4/5, RLE 5/5 Psychiatric: grossly normal mood and affect, speech fluent and appropriate Neurologic: CN 2-12 intact except as above, pass pointing RUE, none LUE. Heel to shin BLE normal bilaterally.    New data reviewed:  Carotids unremarkalbe  MRI/A as above  Pertinent data since admission:  CT Head revealed No acute findings. Mucosal thickening in the paranasal sinuses, likely inflammatory  EKG SR   Ethyl alcohol serum 108 on admission  AST 46 c/w alcohol use  LDL 70  Pending data:  Echo  Scheduled Meds: .  stroke: mapping our early stages of recovery book   Does not apply Once  . amLODipine  2.5 mg Oral Daily  . aspirin  300 mg Rectal Daily   Or  . aspirin  325 mg Oral Daily  . budesonide-formoterol  2 puff Inhalation BID  . enoxaparin (LOVENOX) injection  40 mg Subcutaneous Q24H  . folic acid  1 mg Oral Daily  . LORazepam  0-4 mg Oral Q6H   Followed by  . [START ON 12/25/2014] LORazepam  0-4 mg Oral Q12H  . multivitamin with minerals  1 tablet Oral Daily  . thiamine  100  mg Oral Daily   Or  . thiamine  100 mg Intravenous Daily   Continuous Infusions:   Principal Problem:   Left pontine stroke Active Problems:   TOBACCO USER   COPD (chronic obstructive pulmonary disease)   HTN (hypertension)   Alcohol dependence   Alcohol intoxication   CVA (cerebral infarction)   HCV (hepatitis C virus)   I, Jessica D. Jari Pigg, acting as scribe, recorded this note contemporaneously in the presence of Dr. Melton Alar. Irene Limbo, M.D. on 12/23/2014 .  I have reviewed the above documentation for accuracy and completeness, and I agree with the above. Brendia Sacks, MD

## 2014-12-23 NOTE — Progress Notes (Signed)
Patient off floor in MRI, unable to get VS and do neuro check at this time

## 2014-12-23 NOTE — ED Notes (Addendum)
Pt ambulated to bathroom with assistance.

## 2014-12-24 LAB — HEMOGLOBIN A1C
HEMOGLOBIN A1C: 5.9 % — AB (ref 4.8–5.6)
MEAN PLASMA GLUCOSE: 123 mg/dL

## 2014-12-30 ENCOUNTER — Ambulatory Visit: Payer: Medicaid Other | Admitting: Family Medicine

## 2015-01-01 ENCOUNTER — Encounter (HOSPITAL_COMMUNITY): Payer: Medicaid Other | Admitting: Speech Pathology

## 2015-01-02 ENCOUNTER — Encounter: Payer: Self-pay | Admitting: Family Medicine

## 2015-01-02 ENCOUNTER — Ambulatory Visit (INDEPENDENT_AMBULATORY_CARE_PROVIDER_SITE_OTHER): Payer: Medicaid Other | Admitting: Family Medicine

## 2015-01-02 VITALS — BP 120/62 | HR 91 | Resp 16 | Ht 64.0 in | Wt 117.0 lb

## 2015-01-02 DIAGNOSIS — F10229 Alcohol dependence with intoxication, unspecified: Secondary | ICD-10-CM

## 2015-01-02 DIAGNOSIS — I10 Essential (primary) hypertension: Secondary | ICD-10-CM | POA: Diagnosis not present

## 2015-01-02 DIAGNOSIS — F17200 Nicotine dependence, unspecified, uncomplicated: Secondary | ICD-10-CM

## 2015-01-02 DIAGNOSIS — J4520 Mild intermittent asthma, uncomplicated: Secondary | ICD-10-CM

## 2015-01-02 DIAGNOSIS — I6350 Cerebral infarction due to unspecified occlusion or stenosis of unspecified cerebral artery: Secondary | ICD-10-CM | POA: Diagnosis not present

## 2015-01-02 DIAGNOSIS — Z720 Tobacco use: Secondary | ICD-10-CM

## 2015-01-02 DIAGNOSIS — F1429 Cocaine dependence with unspecified cocaine-induced disorder: Secondary | ICD-10-CM

## 2015-01-02 DIAGNOSIS — I639 Cerebral infarction, unspecified: Secondary | ICD-10-CM

## 2015-01-02 NOTE — Patient Instructions (Addendum)
F/u in December  (NOT wellness)  Please ensure you take all medication as prescribed   You need to STOP alcohol, crack and cigarettes if you want to live and to  be healthier  You had a stroke because of your drug habits  So these need to stop  Keep appt with Dr Gerilyn Pilgrim, neurologist  On 01/07/2015 at 11 am to follow up stroke

## 2015-01-04 NOTE — Assessment & Plan Note (Signed)
counselled again of the need to stop alcohol and polysubstance use, remains in denial, no commitment to treatment/ rehab program

## 2015-01-04 NOTE — Assessment & Plan Note (Signed)
Controlled, no change in medication DASH diet and commitment to daily physical activity for a minimum of 30 minutes discussed and encouraged, as a part of hypertension management. The importance of attaining a healthy weight is also discussed.  BP/Weight 01/02/2015 12/23/2014 12/23/2014 12/22/2014 12/18/2014 10/22/2014 10/21/2014  Systolic BP 120 - 103 - 122 130 152  Diastolic BP 62 - 60 - 80 80 88  Wt. (Lbs) 117 114 - 114 116.08 112 110  BMI 20.07 19.27 - 19.27 19.92 19.22 18.87       

## 2015-01-04 NOTE — Assessment & Plan Note (Signed)
normal power in right upper and lower extremities, normal speech and swallowing Residual RUE numbness present. Has appt for neurology f/u Pt to take aspirin 81 mg daily

## 2015-01-04 NOTE — Assessment & Plan Note (Signed)
Re educated pt about the assoaciaition of her cocaine use and stroke, denies current use but no commitment to attending rehab

## 2015-01-04 NOTE — Assessment & Plan Note (Signed)

## 2015-01-04 NOTE — Assessment & Plan Note (Signed)
Statin started during recent hospitalization, pt has yet to fill her new medications, 1 week post d/c

## 2015-01-04 NOTE — Assessment & Plan Note (Signed)
Controlled, no change in medication  

## 2015-01-04 NOTE — Progress Notes (Signed)
Subjective:    Patient ID: Yvonne Clay, female    DOB: 1955/05/26, 59 y.o.   MRN: [id number]  HPI  Pt in for hospital follow up, admitted with acute CVA, likely induced by cocaine use. She continues to abuse nicotine , alcohol and cocaine and remains in denial about the gravity of her sityuation  C/o ongoing numbness in her right hand, denies weakness in any extremity. Has not filled new scripts yet due to financial constraints Review of Systems See HPI Denies recent fever or chills. Denies sinus pressure, nasal congestion, ear pain or sore throat. Denies chest congestion, chronic smoker's  cough , scant wheezing. Denies chest pains, palpitations and leg swelling Denies abdominal pain, nausea, vomiting,diarrhea or constipation.   Denies dysuria, frequency, hesitancy or incontinence. Denies joint pain, swelling and limitation in mobility. Denies headaches, seizures,  Denies skin break down or rash.        Objective:   Physical Exam BP 120/62 mmHg  Pulse 91  Resp 16  Ht  (1.626 m)  Wt 117 lb (53.071 kg)  BMI 20.07 kg/m2  SpO2 94%   Patient alert  and in no cardiopulmonary distress.Under nourished, appears older than stated age HEENT: No facial asymmetry, EOMI,   oropharynx pink and moist.  Neck supple no JVD, no mass.  Chest: Adequate though reduced  air entry no wheezes, no crackles  CVS: S1, S2 no murmurs, no S3.Regular rate.  ABD: Soft non tender.   Ext: No edema  MS: Adequate ROM spine, shoulders, hips and knees.  Skin: Intact, no ulcerations or rash noted.  Psych: Good eye contact, normal affect. Memory impaired not anxious or depressed appearing.  CNS: CN 2-12 intact, power,  normal throughout.no focal deficits noted.      Assessment & Plan:  Left pontine stroke normal power in right upper and lower extremities, normal speech and swallowing Residual RUE numbness present. Has appt for neurology f/u Pt to take aspirin 81 mg daily  HTN  (hypertension) Controlled, no change in medication DASH diet and commitment to daily physical activity for a minimum of 30 minutes discussed and encouraged, as a part of hypertension management. The importance of attaining a healthy weight is also discussed.  BP/Weight 01/02/2015 12/23/2014 12/23/2014 12/22/2014 12/18/2014 10/22/2014 10/21/2014  Systolic BP 120 - 103 - 122 130 152  Diastolic BP 62 - 60 - 80 80 88  Wt. (Lbs) 117 114 - 114 116.08 112 110  BMI 20.07 19.27 - 19.27 19.92 19.22 18.87        Alcohol dependence counselled again of the need to stop alcohol and polysubstance use, remains in denial, no commitment to treatment/ rehab program  TOBACCO USER Patient counseled for approximately 5 minutes regarding the health risks of ongoing nicotine use, specifically all types of cancer, heart disease, stroke and respiratory failure. The options available for help with cessation ,the behavioral changes to assist the process, and the option to either gradully reduce usage  Or abruptly stop.is also discussed. Pt is also encouraged to set specific goals in number of cigarettes used daily, as well as to set a quit date.  Number of cigarettes/cigars currently smoking daily: 10  Cocaine dependence Re educated pt about the assoaciaition of her cocaine use and stroke, denies current use but no commitment to attending rehab  CVA (cerebral infarction) Statin started during recent hospitalization, pt has yet to fill her new medications, 1 week post d/c  Intrinsic asthma Controlled, no change in medication

## 2015-02-05 ENCOUNTER — Encounter: Payer: Self-pay | Admitting: Family Medicine

## 2015-02-05 NOTE — Assessment & Plan Note (Signed)

## 2015-02-05 NOTE — Assessment & Plan Note (Signed)
Controlled, no change in medication DASH diet and commitment to daily physical activity for a minimum of 30 minutes discussed and encouraged, as a part of hypertension management. The importance of attaining a healthy weight is also discussed.  BP/Weight 01/02/2015 12/23/2014 12/23/2014 12/22/2014 12/18/2014 10/22/2014 10/21/2014  Systolic BP 120 - 103 - 122 130 152  Diastolic BP 62 - 60 - 80 80 88  Wt. (Lbs) 117 114 - 114 116.08 112 110  BMI 20.07 19.27 - 19.27 19.92 19.22 18.87

## 2015-02-05 NOTE — Assessment & Plan Note (Signed)
Ongoing alcohol use places pt at high risk for infection and chronic malnutrition, she remains in denial always stating "I can quit' again counseled and encouraged to join AA for help with alcohol and drug  addiction

## 2015-02-05 NOTE — Assessment & Plan Note (Signed)
Decongestant and antibiotic prescribed and pt to use tylenol for pain and fever

## 2015-04-14 ENCOUNTER — Encounter: Payer: Medicaid Other | Admitting: Family Medicine

## 2015-07-08 ENCOUNTER — Encounter: Payer: Medicaid Other | Admitting: Family Medicine
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
## 2015-07-14 ENCOUNTER — Ambulatory Visit: Payer: Medicaid Other | Admitting: Family Medicine

## 2015-11-13 ENCOUNTER — Ambulatory Visit (INDEPENDENT_AMBULATORY_CARE_PROVIDER_SITE_OTHER): Payer: Medicaid Other | Admitting: Family Medicine

## 2015-11-13 ENCOUNTER — Encounter: Payer: Self-pay | Admitting: Family Medicine

## 2015-11-13 VITALS — BP 130/76 | HR 80 | Resp 18 | Ht 64.0 in | Wt 120.0 lb

## 2015-11-13 DIAGNOSIS — J4520 Mild intermittent asthma, uncomplicated: Secondary | ICD-10-CM | POA: Diagnosis not present

## 2015-11-13 DIAGNOSIS — G4700 Insomnia, unspecified: Secondary | ICD-10-CM

## 2015-11-13 DIAGNOSIS — F17200 Nicotine dependence, unspecified, uncomplicated: Secondary | ICD-10-CM

## 2015-11-13 DIAGNOSIS — R5383 Other fatigue: Secondary | ICD-10-CM

## 2015-11-13 DIAGNOSIS — M542 Cervicalgia: Secondary | ICD-10-CM

## 2015-11-13 DIAGNOSIS — E559 Vitamin D deficiency, unspecified: Secondary | ICD-10-CM | POA: Diagnosis not present

## 2015-11-13 MED ORDER — FLUOXETINE HCL 20 MG PO TABS: 20.0000 mg | ORAL_TABLET | Freq: Every day | ORAL | Status: DC

## 2015-11-13 MED ORDER — CYCLOBENZAPRINE HCL 10 MG PO TABS: ORAL_TABLET | ORAL | Status: DC

## 2015-11-13 NOTE — Assessment & Plan Note (Addendum)
Reports depression due tor recent passing of Mother and brother, states fluoxetuiine has helped in the past, will resume same, not suicidal or homicidal, therapy offered and refused

## 2015-11-13 NOTE — Patient Instructions (Addendum)
Annual physical exam in 2 month, call if you need me sooner   Fluoxetine is sent for depression, sorry to hear of your recent loss.Also muscle relaxant   All of your other regular medication except blood pressure medicine is being refilled, your blood pressure is normal and you do not need medication  Fasting lipid, cmp and EGFr, THS and vit D in 2 month

## 2015-11-14 ENCOUNTER — Other Ambulatory Visit: Payer: Self-pay

## 2015-11-14 DIAGNOSIS — J45909 Unspecified asthma, uncomplicated: Secondary | ICD-10-CM

## 2015-11-14 MED ORDER — ATORVASTATIN CALCIUM 10 MG PO TABS: 10.0000 mg | ORAL_TABLET | Freq: Every day | ORAL | Status: DC

## 2015-11-14 MED ORDER — ALBUTEROL SULFATE HFA 108 (90 BASE) MCG/ACT IN AERS: 1.0000 | INHALATION_SPRAY | RESPIRATORY_TRACT | Status: DC | PRN

## 2015-11-14 MED ORDER — BUDESONIDE-FORMOTEROL FUMARATE 160-4.5 MCG/ACT IN AERO: 2.0000 | INHALATION_SPRAY | Freq: Two times a day (BID) | RESPIRATORY_TRACT | Status: DC

## 2015-11-14 MED ORDER — TEMAZEPAM 15 MG PO CAPS: 15.0000 mg | ORAL_CAPSULE | Freq: Every evening | ORAL | Status: DC | PRN

## 2015-11-14 MED ORDER — VITAMIN D (ERGOCALCIFEROL) 1.25 MG (50000 UNIT) PO CAPS: 50000.0000 [IU] | ORAL_CAPSULE | ORAL | Status: DC

## 2015-11-16 NOTE — Assessment & Plan Note (Signed)
counseled pt re need to discontinue alcohol and drug use and to seek help through AA and NA, remains in denial and refuses these services

## 2015-11-16 NOTE — Assessment & Plan Note (Signed)
Controlled, no change in medication  

## 2015-11-16 NOTE — Progress Notes (Signed)
   Yvonne SimmerDebhroa A Clay     MRN: [id number]      DOB: 09/20/1955   HPI Yvonne Clay is here for follow up and re-evaluation of chronic medical conditions, medication management and review of any available recent lab and radiology data.  Preventive health is updated, specifically  Cancer screening and Immunization.   Colonoscopy is past due also her pap The PT denies any adverse reactions to current medications since the last visit.  Receintly lost 2 family members and is grieving , requests anti depressant medication which she has used in the past be resumed, not suicidal or homicidal, declines therapy for support, states she ahs a network to help her  ROS Denies recent fever or chills. Denies sinus pressure, nasal congestion, ear pain or sore throat. Denies chest congestion, has chronic cough, no sputum , has intermittent  wheezing. Denies chest pains, palpitations and leg swelling Denies abdominal pain, nausea, vomiting,diarrhea or constipation.   Denies dysuria, frequency, hesitancy or incontinence. Chronic neck pain and spasm Denies headaches, seizures, numbness, orDenies depression, anxiety or insomnia. Denies skin break down or rash.   PE  BP 130/76 mmHg  Pulse 80  Resp 18  Ht 5\' 4"  (1.626 m)  Wt 120 lb (54.432 kg)  BMI 20.59 kg/m2  SpO2 92%  Patient alert and oriented and in no cardiopulmonary distress.  HEENT: No facial asymmetry, EOMI,   oropharynx pink and moist.  Neck decreased ROM with spasm, no JVD, no mass.  Chest: Clear to auscultation bilaterally.Decreased though adequate air entry  CVS: S1, S2 no murmurs, no S3.Regular rate.  ABD: Soft non tender.   Ext: No edema  MS: Adequate ROM spine, shoulders, hips and knees.  Skin: Intact, no ulcerations or rash noted.  Psych: Good eye contact, normal affect. Memory iimpaired not anxious , mildly  depressed appearing.  CNS: CN 2-12 intact, power,  normal throughout.no focal deficits noted.   Assessment &  Plan  DEPRESSION Reports depression due tor recent passing of Mother and brother, states fluoxetuiine has helped in the past, will resume same, not suicidal or homicidal, therapy offered and refused  TOBACCO USER Patient counseled for approximately 5 minutes regarding the health risks of ongoing nicotine use, specifically all types of cancer, heart disease, stroke and respiratory failure. The options available for help with cessation ,the behavioral changes to assist the process, and the option to either gradully reduce usage  Or abruptly stop.is also discussed. Pt is also encouraged to set specific goals in number of cigarettes used daily, as well as to set a quit date.     Intrinsic asthma Controlled, no change in medication   NECK PAIN, CHRONIC Chronic neck pain and spasm, flexeril refilled  Insomnia Sleep hygiene reviewed and written information offered also. Prescription sent for  medication needed.   OTH&UNSPEC ALCOHOL DEPENDENCE UNSPEC DRUNKENNESS counseled pt re need to discontinue alcohol and drug use and to seek help through AA and NA, remains in denial and refuses these services

## 2015-11-16 NOTE — Assessment & Plan Note (Signed)
Chronic neck pain and spasm, flexeril refilled

## 2015-11-16 NOTE — Assessment & Plan Note (Signed)
Sleep hygiene reviewed and written information offered also. Prescription sent for  medication needed.  

## 2015-11-16 NOTE — Assessment & Plan Note (Signed)

## 2016-01-28 ENCOUNTER — Encounter: Payer: Self-pay | Admitting: Family Medicine

## 2016-02-05 ENCOUNTER — Encounter: Payer: Medicaid Other | Admitting: Family Medicine

## 2016-02-25 ENCOUNTER — Encounter: Payer: Medicaid Other | Admitting: Family Medicine

## 2016-03-16 ENCOUNTER — Encounter: Payer: Medicaid Other | Admitting: Family Medicine

## 2016-05-17 ENCOUNTER — Encounter: Payer: Self-pay | Admitting: Family Medicine

## 2016-05-17 ENCOUNTER — Encounter: Payer: Medicaid Other | Admitting: Family Medicine

## 2016-06-09 ENCOUNTER — Telehealth: Payer: Self-pay | Admitting: Family Medicine

## 2016-06-09 DIAGNOSIS — N6489 Other specified disorders of breast: Secondary | ICD-10-CM

## 2016-06-09 DIAGNOSIS — Z09 Encounter for follow-up examination after completed treatment for conditions other than malignant neoplasm: Secondary | ICD-10-CM

## 2016-06-09 DIAGNOSIS — Q838 Other congenital malformations of breast: Secondary | ICD-10-CM | POA: Insufficient documentation

## 2016-06-09 NOTE — Telephone Encounter (Signed)
Yvonne Clay is needing an order placed for a Diagnostic Tomo Bil mammo and an U/S lmtd Lft & Rt for Poss Asymmetry in both breast, please advise?

## 2016-06-09 NOTE — Telephone Encounter (Signed)
Orders placed.

## 2016-06-10 ENCOUNTER — Telehealth: Payer: Self-pay | Admitting: Family Medicine

## 2016-06-10 ENCOUNTER — Other Ambulatory Visit: Payer: Self-pay

## 2016-06-10 MED ORDER — BUDESONIDE-FORMOTEROL FUMARATE 160-4.5 MCG/ACT IN AERO: 2.0000 | INHALATION_SPRAY | Freq: Two times a day (BID) | RESPIRATORY_TRACT | 3 refills | Status: DC

## 2016-06-10 MED ORDER — CYCLOBENZAPRINE HCL 10 MG PO TABS: ORAL_TABLET | ORAL | 3 refills | Status: DC

## 2016-06-10 NOTE — Telephone Encounter (Signed)
meds refilled 

## 2016-06-10 NOTE — Telephone Encounter (Signed)
Yvonne Clay is stating that she needs refills on cyclobenzaprine (FLEXERIL) 10 MG tablet and budesonide-formoterol (SYMBICORT) 160-4.5 MCG/ACT inhaler to [facility]

## 2016-06-10 NOTE — Telephone Encounter (Signed)
mammo scheduled for 06/22/16 at 3:00 patient aware

## 2016-06-22 ENCOUNTER — Encounter (HOSPITAL_COMMUNITY): Payer: Medicaid Other

## 2016-07-06 ENCOUNTER — Encounter (HOSPITAL_COMMUNITY): Payer: Medicaid Other

## 2016-07-08 ENCOUNTER — Encounter (HOSPITAL_COMMUNITY): Payer: Self-pay | Admitting: *Deleted

## 2016-07-19 ENCOUNTER — Encounter: Payer: Medicaid Other | Admitting: Family Medicine

## 2016-09-26 ENCOUNTER — Emergency Department (HOSPITAL_COMMUNITY): Payer: Medicaid Other

## 2016-09-26 ENCOUNTER — Encounter (HOSPITAL_COMMUNITY): Payer: Self-pay | Admitting: *Deleted

## 2016-09-26 ENCOUNTER — Inpatient Hospital Stay (HOSPITAL_COMMUNITY): Payer: Medicaid Other

## 2016-09-26 ENCOUNTER — Inpatient Hospital Stay (HOSPITAL_COMMUNITY)
Admission: EM | Admit: 2016-09-26 | Discharge: 2016-10-01 | DRG: 871 | Disposition: E | Payer: Medicaid Other | Attending: Internal Medicine | Admitting: Internal Medicine

## 2016-09-26 DIAGNOSIS — J189 Pneumonia, unspecified organism: Secondary | ICD-10-CM | POA: Diagnosis not present

## 2016-09-26 DIAGNOSIS — Z90710 Acquired absence of both cervix and uterus: Secondary | ICD-10-CM | POA: Diagnosis not present

## 2016-09-26 DIAGNOSIS — J440 Chronic obstructive pulmonary disease with acute lower respiratory infection: Secondary | ICD-10-CM | POA: Diagnosis present

## 2016-09-26 DIAGNOSIS — Z978 Presence of other specified devices: Secondary | ICD-10-CM

## 2016-09-26 DIAGNOSIS — R918 Other nonspecific abnormal finding of lung field: Secondary | ICD-10-CM | POA: Diagnosis present

## 2016-09-26 DIAGNOSIS — I10 Essential (primary) hypertension: Secondary | ICD-10-CM | POA: Diagnosis present

## 2016-09-26 DIAGNOSIS — R740 Nonspecific elevation of levels of transaminase and lactic acid dehydrogenase [LDH]: Secondary | ICD-10-CM

## 2016-09-26 DIAGNOSIS — A419 Sepsis, unspecified organism: Secondary | ICD-10-CM | POA: Diagnosis present

## 2016-09-26 DIAGNOSIS — F1020 Alcohol dependence, uncomplicated: Secondary | ICD-10-CM | POA: Diagnosis present

## 2016-09-26 DIAGNOSIS — F17210 Nicotine dependence, cigarettes, uncomplicated: Secondary | ICD-10-CM | POA: Diagnosis present

## 2016-09-26 DIAGNOSIS — F10231 Alcohol dependence with withdrawal delirium: Secondary | ICD-10-CM

## 2016-09-26 DIAGNOSIS — G9340 Encephalopathy, unspecified: Secondary | ICD-10-CM

## 2016-09-26 DIAGNOSIS — Z880 Allergy status to penicillin: Secondary | ICD-10-CM | POA: Diagnosis not present

## 2016-09-26 DIAGNOSIS — J984 Other disorders of lung: Secondary | ICD-10-CM | POA: Diagnosis not present

## 2016-09-26 DIAGNOSIS — Z8673 Personal history of transient ischemic attack (TIA), and cerebral infarction without residual deficits: Secondary | ICD-10-CM | POA: Diagnosis not present

## 2016-09-26 DIAGNOSIS — J181 Lobar pneumonia, unspecified organism: Secondary | ICD-10-CM | POA: Diagnosis present

## 2016-09-26 DIAGNOSIS — I469 Cardiac arrest, cause unspecified: Secondary | ICD-10-CM | POA: Diagnosis not present

## 2016-09-26 DIAGNOSIS — Z789 Other specified health status: Secondary | ICD-10-CM

## 2016-09-26 DIAGNOSIS — R569 Unspecified convulsions: Secondary | ICD-10-CM | POA: Diagnosis not present

## 2016-09-26 DIAGNOSIS — R6521 Severe sepsis with septic shock: Secondary | ICD-10-CM | POA: Diagnosis present

## 2016-09-26 DIAGNOSIS — R109 Unspecified abdominal pain: Secondary | ICD-10-CM

## 2016-09-26 DIAGNOSIS — G9341 Metabolic encephalopathy: Secondary | ICD-10-CM | POA: Diagnosis present

## 2016-09-26 DIAGNOSIS — F17200 Nicotine dependence, unspecified, uncomplicated: Secondary | ICD-10-CM | POA: Diagnosis present

## 2016-09-26 DIAGNOSIS — K7030 Alcoholic cirrhosis of liver without ascites: Secondary | ICD-10-CM | POA: Diagnosis present

## 2016-09-26 DIAGNOSIS — R0602 Shortness of breath: Secondary | ICD-10-CM | POA: Diagnosis present

## 2016-09-26 DIAGNOSIS — Z7982 Long term (current) use of aspirin: Secondary | ICD-10-CM | POA: Diagnosis not present

## 2016-09-26 DIAGNOSIS — J9601 Acute respiratory failure with hypoxia: Secondary | ICD-10-CM

## 2016-09-26 DIAGNOSIS — I959 Hypotension, unspecified: Secondary | ICD-10-CM | POA: Diagnosis present

## 2016-09-26 DIAGNOSIS — F1410 Cocaine abuse, uncomplicated: Secondary | ICD-10-CM | POA: Diagnosis present

## 2016-09-26 DIAGNOSIS — D62 Acute posthemorrhagic anemia: Secondary | ICD-10-CM | POA: Diagnosis present

## 2016-09-26 DIAGNOSIS — R7401 Elevation of levels of liver transaminase levels: Secondary | ICD-10-CM

## 2016-09-26 LAB — RAPID URINE DRUG SCREEN, HOSP PERFORMED
Amphetamines: NOT DETECTED
BARBITURATES: NOT DETECTED
Benzodiazepines: NOT DETECTED
COCAINE: POSITIVE — AB
Opiates: NOT DETECTED
TETRAHYDROCANNABINOL: NOT DETECTED

## 2016-09-26 LAB — PROTIME-INR
INR: 1.34
Prothrombin Time: 16.7 seconds — ABNORMAL HIGH (ref 11.4–15.2)

## 2016-09-26 LAB — IRON AND TIBC
IRON: 295 ug/dL — AB (ref 28–170)
Saturation Ratios: 120 % — ABNORMAL HIGH (ref 10.4–31.8)
TIBC: 246 ug/dL — ABNORMAL LOW (ref 250–450)

## 2016-09-26 LAB — CBC
HCT: 16.6 % — ABNORMAL LOW (ref 36.0–46.0)
HCT: 5.8 % — ABNORMAL LOW (ref 36.0–46.0)
HEMOGLOBIN: 5.9 g/dL — AB (ref 12.0–15.0)
HEMOGLOBIN: 2.4 g/dL — AB (ref 12.0–15.0)
MCH: 35.3 pg — ABNORMAL HIGH (ref 26.0–34.0)
MCH: 42.9 pg — AB (ref 26.0–34.0)
MCHC: 35.5 g/dL (ref 30.0–36.0)
MCHC: 41.4 g/dL — AB (ref 30.0–36.0)
MCV: 99.4 fL (ref 78.0–100.0)
MCV: 103.6 fL — AB (ref 78.0–100.0)
Platelets: 142 10*3/uL — ABNORMAL LOW (ref 150–400)
Platelets: 75 10*3/uL — ABNORMAL LOW (ref 150–400)
RBC: 1.67 MIL/uL — AB (ref 3.87–5.11)
RBC: 0.56 MIL/uL — AB (ref 3.87–5.11)
RDW: 12.5 % (ref 11.5–15.5)
RDW: 13.5 % (ref 11.5–15.5)
WBC: 26.7 10*3/uL — AB (ref 4.0–10.5)
WBC: 21.3 10*3/uL — ABNORMAL HIGH (ref 4.0–10.5)

## 2016-09-26 LAB — BLOOD GAS, ARTERIAL
Drawn by: 221791
FIO2: 100
LHR: 14 {breaths}/min
O2 Saturation: 99.1 %
PEEP: 5 cmH2O
Patient temperature: 33.8
VT: 500 mL
pH, Arterial: 6.946 — CL (ref 7.350–7.450)
pO2, Arterial: 492 mmHg — ABNORMAL HIGH (ref 83.0–108.0)

## 2016-09-26 LAB — BASIC METABOLIC PANEL
Anion gap: 27 — ABNORMAL HIGH (ref 5–15)
BUN: 26 mg/dL — AB (ref 6–20)
CALCIUM: 6.9 mg/dL — AB (ref 8.9–10.3)
CO2: 11 mmol/L — ABNORMAL LOW (ref 22–32)
Chloride: 108 mmol/L (ref 101–111)
Creatinine, Ser: 1.23 mg/dL — ABNORMAL HIGH (ref 0.44–1.00)
GFR calc Af Amer: 55 mL/min — ABNORMAL LOW (ref >60)
GFR calc non Af Amer: 47 mL/min — ABNORMAL LOW (ref >60)
GLUCOSE: 6 mg/dL — AB (ref 65–99)
Potassium: 9.1 mmol/L (ref 3.5–5.1)
Sodium: 146 mmol/L — ABNORMAL HIGH (ref 135–145)

## 2016-09-26 LAB — DIFFERENTIAL
Basophils Absolute: 0.3 10*3/uL — ABNORMAL HIGH (ref 0.0–0.1)
Basophils Relative: 1 %
Eosinophils Absolute: 1.6 10*3/uL — ABNORMAL HIGH (ref 0.0–0.7)
Eosinophils Relative: 6 %
LYMPHS ABS: 4.3 10*3/uL — AB (ref 0.7–4.0)
Lymphocytes Relative: 16 %
MONO ABS: 1.1 10*3/uL — AB (ref 0.1–1.0)
Monocytes Relative: 4 %
NEUTROS ABS: 19.4 10*3/uL — AB (ref 1.7–7.7)
NEUTROS PCT: 73 %

## 2016-09-26 LAB — TROPONIN I
Troponin I: 0.08 ng/mL (ref <0.03)
Troponin I: 0.37 ng/mL (ref <0.03)

## 2016-09-26 LAB — URINALYSIS, ROUTINE W REFLEX MICROSCOPIC
GLUCOSE, UA: 100 mg/dL — AB
KETONES UR: 15 mg/dL — AB
Nitrite: POSITIVE — AB
PH: 6.5 (ref 5.0–8.0)
Protein, ur: >300 mg/dL — AB
Specific Gravity, Urine: 1.02 (ref 1.005–1.030)

## 2016-09-26 LAB — LACTIC ACID, PLASMA
LACTIC ACID, VENOUS: 5.5 mmol/L — AB (ref 0.5–1.9)
Lactic Acid, Venous: 21.5 mmol/L (ref 0.5–1.9)

## 2016-09-26 LAB — COMPREHENSIVE METABOLIC PANEL
ALBUMIN: 3.3 g/dL — AB (ref 3.5–5.0)
ALK PHOS: 111 U/L (ref 38–126)
ALT: 52 U/L (ref 14–54)
ANION GAP: 14 (ref 5–15)
AST: 189 U/L — AB (ref 15–41)
BUN: 23 mg/dL — ABNORMAL HIGH (ref 6–20)
CO2: 17 mmol/L — AB (ref 22–32)
Calcium: 8.7 mg/dL — ABNORMAL LOW (ref 8.9–10.3)
Chloride: 104 mmol/L (ref 101–111)
Creatinine, Ser: 0.97 mg/dL (ref 0.44–1.00)
GFR calc Af Amer: >60 mL/min (ref >60)
GFR calc non Af Amer: >60 mL/min (ref >60)
GLUCOSE: 107 mg/dL — AB (ref 65–99)
Potassium: 4.2 mmol/L (ref 3.5–5.1)
SODIUM: 135 mmol/L (ref 135–145)
Total Bilirubin: 7.3 mg/dL — ABNORMAL HIGH (ref 0.3–1.2)
Total Protein: 8 g/dL (ref 6.5–8.1)

## 2016-09-26 LAB — PROCALCITONIN: Procalcitonin: 2.76 ng/mL

## 2016-09-26 LAB — URINALYSIS, MICROSCOPIC (REFLEX): SQUAMOUS EPITHELIAL / LPF: NONE SEEN

## 2016-09-26 LAB — I-STAT CG4 LACTIC ACID, ED: Lactic Acid, Venous: 5.68 mmol/L (ref 0.5–1.9)

## 2016-09-26 LAB — TRIGLYCERIDES: TRIGLYCERIDES: 271 mg/dL — AB (ref <150)

## 2016-09-26 LAB — PREPARE RBC (CROSSMATCH)

## 2016-09-26 LAB — ETHANOL: Alcohol, Ethyl (B): <5 mg/dL (ref <5)

## 2016-09-26 LAB — ABO/RH: ABO/RH(D): B POS

## 2016-09-26 LAB — AMMONIA: Ammonia: 32 umol/L (ref 9–35)

## 2016-09-26 LAB — FOLATE: FOLATE: 13.2 ng/mL (ref >5.9)

## 2016-09-26 LAB — TSH: TSH: 3.1 u[IU]/mL (ref 0.350–4.500)

## 2016-09-26 LAB — VITAMIN B12: Vitamin B-12: 392 pg/mL (ref 180–914)

## 2016-09-26 LAB — LACTATE DEHYDROGENASE: LDH: 1766 U/L — AB (ref 98–192)

## 2016-09-26 LAB — FERRITIN

## 2016-09-26 LAB — POC OCCULT BLOOD, ED: FECAL OCCULT BLD: NEGATIVE

## 2016-09-26 LAB — CK: Total CK: 68 U/L (ref 38–234)

## 2016-09-26 MED ORDER — SODIUM CHLORIDE 0.9% FLUSH: 3.0000 mL | Freq: Two times a day (BID) | INTRAVENOUS | Status: DC

## 2016-09-26 MED ORDER — HALOPERIDOL LACTATE 5 MG/ML IJ SOLN: 5.0000 mg | Freq: Four times a day (QID) | INTRAMUSCULAR | Status: DC | PRN

## 2016-09-26 MED ORDER — ACETAMINOPHEN 650 MG RE SUPP: 650.0000 mg | Freq: Four times a day (QID) | RECTAL | Status: DC | PRN

## 2016-09-26 MED ORDER — SODIUM CHLORIDE 0.9 % IV BOLUS (SEPSIS)
250.0000 mL | Freq: Once | INTRAVENOUS | Status: AC
  Administered 2016-09-26: 250 mL via INTRAVENOUS

## 2016-09-26 MED ORDER — SODIUM CHLORIDE 0.9 % IV BOLUS (SEPSIS)
1000.0000 mL | Freq: Once | INTRAVENOUS | Status: AC
  Administered 2016-09-26: 1000 mL via INTRAVENOUS

## 2016-09-26 MED ORDER — SODIUM BICARBONATE 8.4 % IV SOLN: 100.0000 meq | Freq: Once | INTRAVENOUS | Status: DC

## 2016-09-26 MED ORDER — LORAZEPAM 1 MG PO TABS: 1.0000 mg | ORAL_TABLET | Freq: Four times a day (QID) | ORAL | Status: DC | PRN

## 2016-09-26 MED ORDER — FOLIC ACID 1 MG PO TABS: 1.0000 mg | ORAL_TABLET | Freq: Every day | ORAL | Status: DC

## 2016-09-26 MED ORDER — HALOPERIDOL LACTATE 5 MG/ML IJ SOLN: 10.0000 mg | Freq: Once | INTRAMUSCULAR | Status: DC

## 2016-09-26 MED ORDER — THIAMINE HCL 100 MG/ML IJ SOLN: 100.0000 mg | Freq: Every day | INTRAMUSCULAR | Status: DC

## 2016-09-26 MED ORDER — IOPAMIDOL (ISOVUE-300) INJECTION 61%
75.0000 mL | Freq: Once | INTRAVENOUS | Status: AC | PRN
  Administered 2016-09-26: 75 mL via INTRAVENOUS

## 2016-09-26 MED ORDER — PROPOFOL 1000 MG/100ML IV EMUL
5.0000 ug/kg/min | INTRAVENOUS | Status: DC
  Administered 2016-09-26: 10 ug/kg/min via INTRAVENOUS

## 2016-09-26 MED ORDER — NOREPINEPHRINE BITARTRATE 1 MG/ML IV SOLN
0.0000 ug/min | INTRAVENOUS | Status: DC
  Administered 2016-09-26: 2 ug/min via INTRAVENOUS
  Filled 2016-09-26: qty 4

## 2016-09-26 MED ORDER — VANCOMYCIN HCL IN DEXTROSE 1-5 GM/200ML-% IV SOLN
1000.0000 mg | Freq: Once | INTRAVENOUS | Status: AC
  Administered 2016-09-26: 1000 mg via INTRAVENOUS
  Filled 2016-09-26: qty 200

## 2016-09-26 MED ORDER — ONDANSETRON HCL 4 MG PO TABS: 4.0000 mg | ORAL_TABLET | Freq: Four times a day (QID) | ORAL | Status: DC | PRN

## 2016-09-26 MED ORDER — LORAZEPAM 2 MG/ML IJ SOLN
1.0000 mg | Freq: Four times a day (QID) | INTRAMUSCULAR | Status: DC | PRN
  Administered 2016-09-26: 1 mg via INTRAVENOUS
  Filled 2016-09-26: qty 1

## 2016-09-26 MED ORDER — ENOXAPARIN SODIUM 40 MG/0.4ML ~~LOC~~ SOLN: 40.0000 mg | SUBCUTANEOUS | Status: DC

## 2016-09-26 MED ORDER — ACETAMINOPHEN 325 MG PO TABS: 650.0000 mg | ORAL_TABLET | Freq: Four times a day (QID) | ORAL | Status: DC | PRN

## 2016-09-26 MED ORDER — ONDANSETRON HCL 4 MG/2ML IJ SOLN
4.0000 mg | Freq: Four times a day (QID) | INTRAMUSCULAR | Status: DC | PRN
  Administered 2016-09-26: 4 mg via INTRAVENOUS
  Filled 2016-09-26: qty 2

## 2016-09-26 MED ORDER — METRONIDAZOLE IN NACL 5-0.79 MG/ML-% IV SOLN
500.0000 mg | Freq: Three times a day (TID) | INTRAVENOUS | Status: DC
Start: 2016-09-26 — End: 2016-09-27

## 2016-09-26 MED ORDER — SODIUM BICARBONATE 8.4 % IV SOLN
INTRAVENOUS | Status: DC
  Filled 2016-09-26 (×2): qty 150

## 2016-09-26 MED ORDER — SODIUM CHLORIDE 0.9 % IV SOLN: INTRAVENOUS | Status: DC

## 2016-09-26 MED ORDER — POTASSIUM CHLORIDE IN NACL 20-0.9 MEQ/L-% IV SOLN: INTRAVENOUS | Status: DC

## 2016-09-26 MED ORDER — LIDOCAINE HCL (PF) 2 % IJ SOLN
INTRAMUSCULAR | Status: AC
  Administered 2016-09-26: 10 mL
  Filled 2016-09-26: qty 10

## 2016-09-26 MED ORDER — SODIUM CHLORIDE 0.9 % IV BOLUS (SEPSIS): 500.0000 mL | Freq: Once | INTRAVENOUS | Status: DC

## 2016-09-26 MED ORDER — NOREPINEPHRINE 4 MG/250ML-% IV SOLN
INTRAVENOUS | Status: AC
  Filled 2016-09-26: qty 250

## 2016-09-26 MED ORDER — SODIUM BICARBONATE 8.4 % IV SOLN
INTRAVENOUS | Status: AC
  Filled 2016-09-26: qty 150

## 2016-09-26 MED ORDER — DEXTROSE 5 % IV SOLN
2.0000 g | Freq: Once | INTRAVENOUS | Status: AC
  Administered 2016-09-26: 2 g via INTRAVENOUS
  Filled 2016-09-26: qty 2

## 2016-09-26 MED ORDER — HALOPERIDOL LACTATE 5 MG/ML IJ SOLN
5.0000 mg | Freq: Once | INTRAMUSCULAR | Status: AC
  Administered 2016-09-26: 5 mg via INTRAVENOUS
  Filled 2016-09-26: qty 1

## 2016-09-26 MED ORDER — ADULT MULTIVITAMIN W/MINERALS CH: 1.0000 | ORAL_TABLET | Freq: Every day | ORAL | Status: DC

## 2016-09-26 MED ORDER — LEVOFLOXACIN IN D5W 750 MG/150ML IV SOLN
750.0000 mg | Freq: Once | INTRAVENOUS | Status: AC
  Administered 2016-09-26: 750 mg via INTRAVENOUS
  Filled 2016-09-26: qty 150

## 2016-09-26 MED ORDER — SODIUM CHLORIDE 0.9 % IV SOLN
Freq: Once | INTRAVENOUS | Status: AC
  Administered 2016-09-26: 11:00:00 via INTRAVENOUS

## 2016-09-26 MED ORDER — SODIUM CHLORIDE 0.9 % IV BOLUS (SEPSIS)
500.0000 mL | Freq: Once | INTRAVENOUS | Status: AC
  Administered 2016-09-26: 500 mL via INTRAVENOUS

## 2016-09-26 MED ORDER — VANCOMYCIN HCL 500 MG IV SOLR
500.0000 mg | Freq: Two times a day (BID) | INTRAVENOUS | Status: DC
  Filled 2016-09-26 (×2): qty 500

## 2016-09-26 MED ORDER — VITAMIN B-1 100 MG PO TABS: 100.0000 mg | ORAL_TABLET | Freq: Every day | ORAL | Status: DC

## 2016-09-26 MED ORDER — PROPOFOL 1000 MG/100ML IV EMUL
INTRAVENOUS | Status: AC
  Filled 2016-09-26: qty 100

## 2016-09-26 MED ORDER — ALBUMIN HUMAN 5 % IV SOLN
25.0000 g | Freq: Once | INTRAVENOUS | Status: DC
  Filled 2016-09-26: qty 500

## 2016-09-26 MED ORDER — SODIUM CHLORIDE 0.9 % IV SOLN
Freq: Once | INTRAVENOUS | Status: AC
  Administered 2016-09-26: 16:00:00 via INTRAVENOUS

## 2016-09-26 MED ORDER — AZTREONAM 2 G IJ SOLR: 2.0000 g | Freq: Three times a day (TID) | INTRAMUSCULAR | Status: DC

## 2016-09-27 LAB — URINE CULTURE: Culture: NO GROWTH

## 2016-09-27 LAB — HIV ANTIBODY (ROUTINE TESTING W REFLEX): HIV SCREEN 4TH GENERATION: NONREACTIVE

## 2016-09-27 LAB — HEPATITIS B SURFACE ANTIGEN: HEP B S AG: NEGATIVE

## 2016-09-27 LAB — HEPATITIS C ANTIBODY: HCV Ab: >11 s/co ratio — ABNORMAL HIGH (ref 0.0–0.9)

## 2016-09-28 LAB — BPAM RBC
BLOOD PRODUCT EXPIRATION DATE: 201806142359
Blood Product Expiration Date: 201806142359
Blood Product Expiration Date: 201806142359
Blood Product Expiration Date: 201806172359
ISSUE DATE / TIME: 201805182310
ISSUE DATE / TIME: 201805280949
ISSUE DATE / TIME: 201805280338
UNIT TYPE AND RH: 7300
UNIT TYPE AND RH: 7300
UNIT TYPE AND RH: 7300
Unit Type and Rh: 7300

## 2016-09-28 LAB — TYPE AND SCREEN
ABO/RH(D): B POS
ANTIBODY SCREEN: POSITIVE
Antibody ID,T Eluate: UNDETERMINED
DAT, IgG: POSITIVE
UNIT DIVISION: 0
Unit division: 0
Unit division: 0
Unit division: 0

## 2016-09-28 LAB — RPR, QUANT+TP ABS (REFLEX): T Pallidum Abs: POSITIVE — AB

## 2016-09-28 LAB — RPR: RPR: REACTIVE — AB

## 2016-09-29 MED FILL — Medication: Qty: 1 | Status: AC

## 2016-10-01 LAB — CULTURE, BLOOD (ROUTINE X 2)
CULTURE: NO GROWTH
Culture: NO GROWTH
SPECIAL REQUESTS: ADEQUATE
Special Requests: ADEQUATE

## 2016-10-01 NOTE — ED Notes (Signed)
Two female visitors at bedside
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
## 2016-10-01 NOTE — ED Provider Notes (Signed)
[facility]  Department of Emergency Medicine   Code Blue CONSULT NOTE  Chief Complaint: Cardiac arrest/unresponsive   Level V Caveat: Unresponsive  History of present illness: I was contacted by the hospital for a CODE BLUE cardiac arrest upstairs and presented to the patient's bedside.  Per ICU RN, pt s/p Code Blue earlier today with ROSC. Pt already intubated with central line placed. Pt again became bradycardic, then PEA. Code Blue was called again. Upon my arrival, CPR was in progress, IV epi and bicarb was given.    ROS: Unable to obtain, Level V caveat  Scheduled Meds: . enoxaparin (LOVENOX) injection  40 mg Subcutaneous Q24H  . folic acid  1 mg Oral Daily  . multivitamin with minerals  1 tablet Oral Daily  . sodium bicarbonate  100 mEq Intravenous Once  . sodium chloride flush  3 mL Intravenous Q12H  . thiamine  100 mg Oral Daily   Or  . thiamine  100 mg Intravenous Daily   Continuous Infusions: . 0.9 % NaCl with KCl 20 mEq / L    . albumin human    . metronidazole    . norepinephrine (LEVOPHED) Adult infusion 5 mcg/min (09/28/2016 1817)  . propofol    . propofol (DIPRIVAN) infusion 10 mcg/kg/min (09-28-2016 1600)  .  sodium bicarbonate  infusion 1000 mL    . sodium chloride    . vancomycin     PRN Meds:.acetaminophen **OR** acetaminophen, LORazepam **OR** LORazepam, ondansetron **OR** ondansetron (ZOFRAN) IV Past Medical History:  Diagnosis Date  . Arthritis   . COPD (chronic obstructive pulmonary disease) (HCC)   . Depression   . HCV (hepatitis C virus) 12/23/2014  . Hepatitis C dx in 2011 by dentist  . Hypertension   . Substance abuse    Past Surgical History:  Procedure Laterality Date  . ABDOMINAL HYSTERECTOMY    . FRACTURE SURGERY     right leg    Social History   Social History  . Marital status: Widowed    Spouse name: N/A  . Number of children: N/A  . Years of education: N/A   Occupational History  . Not on file.   Social  History Main Topics  . Smoking status: Current Every Day Smoker    Packs/day: 0.30  . Smokeless tobacco: Never Used  . Alcohol use 7.2 oz/week    12 Cans of beer per week     Comment: patient states she drinks about two forty oz drinks a week  . Drug use: No     Comment: Hx of crack use - denies recent use  . Sexual activity: Not on file   Other Topics Concern  . Not on file   Social History Narrative  . No narrative on file   Allergies  Allergen Reactions  . Penicillins     Last set of Vital Signs (not current) Vitals:   Sep 28, 2016 1236 2016-09-28 1652  BP:    Pulse: (!) 102 (!) 32  Resp: 20 (!) 32  Temp: 98.1 F (36.7 C) (!) 92.8 F (33.8 C)      Physical Exam Gen: unresponsive Cardiovascular: pulseless  Resp: apneic. Breath sounds equal bilaterally with bagging  Abd: nondistended  Neuro: GCS 3, unresponsive to pain  HEENT: No blood in posterior pharynx, gag reflex absent  Neck: No crepitus  Musculoskeletal: No deformity  Skin: warm  CRITICAL CARE Performed by: Laray Anger Total critical care time: 35 Critical care time was exclusive of separately billable  procedures and treating other patients. Critical care was necessary to treat or prevent imminent or life-threatening deterioration. Critical care was time spent personally by me on the following activities: development of treatment plan with patient and/or surrogate as well as nursing, discussions with consultants, evaluation of patient's response to treatment, examination of patient, obtaining history from patient or surrogate, ordering and performing treatments and interventions, ordering and review of laboratory studies, ordering and review of radiographic studies, pulse oximetry and re-evaluation of patient's condition.  Cardiopulmonary Resuscitation (CPR) Procedure Note  Directed/Performed by: Laray AngerMCMANUS,Nisreen Guise M I personally directed ancillary staff and/or performed CPR in an effort to regain return  of spontaneous circulation and to maintain cardiac, neuro and systemic perfusion.    Course:  CPR was continued, as well as several more doses of IV epi.  IV bicarb gtt was started. IVF NS bolus infusing. +ROSC, hypotensive. IV levophed started. Sinus to sinus bradycardia on monitor, agonal spontaneous resps, no spontaneous movements. Pt became further bradycardic, then asystolic. CPR begun again. Multiple doses of IV epi and IV bicarb given.  IV bicarb gtt infusing. IV levophed and IV dopamine gtts infusing and titrated upwards. Asystole continued. No central pulses present. No spontaneous resps. No spontaneous movement. TOD called at 1847. Pt's family was updated by [facility] Dr. Arbutus Leasat.      Samuel JesterMcManus, Gagan Dillion, DO 08-01-16 1929

## 2016-10-01 NOTE — Progress Notes (Signed)
Patient up from ED, Patient was thrashing in bed, able to state name and name of Hospital, unable to answer any other questions at this time. Patient was inconsolable, called for charge nurse to assess patient status with me. Based on labs and patient behavior, paged Dr. Arbutus Leasat, ordered 5mg  of Haldol, PRN Haldol given, patient calmed down, still able to verbally state name. Within 10-15 minutes patient began to be non-responsive and had white foam coming out of her mouth. Charge nurse called, rapid response called. Suction, oxygen and vitals initiated.  Rapid response team responded to room, patient transferred to ICU RM 8 at this time.

## 2016-10-01 NOTE — H&P (Signed)
NAMSherrill Raring:  Pixley, Jaine            ACCOUNT NO.:  [id number]  MEDICAL RECORD NO.:  [id number]  LOCATION:                                 FACILITY:  PHYSICIAN:  Aviya Jarvie L. Juanetta GoslingHawkins, M.D.     DATE OF BIRTH:  DATE OF ADMISSION:  04/14/17 DATE OF DISCHARGE:  LH                             HISTORY & PHYSICAL   REASON FOR CONSULTATION:  Abnormal chest CT.  CONSULTING PHYSICIAN:  Dr. Arbutus Leasat, [facility].  HISTORY:  This is a 61 year old who has a long known history of COPD, alcoholism, tobacco abuse, cocaine abuse.  She presented to the emergency department after having had increasing shortness of breath apparently for the last several days, but she has had some increasing shortness of breath for the last several months.  She says that she continues to smoke, but she cannot tell how much.  She has been coughing up some yellowish sputum.  She has not had any hemoptysis.  She does not think that she has lost any weight.  She is a very poor historian, so it is difficult to be sure about the history.  She says that she has some chest discomfort.  She complains that she is hurting everywhere.  She has had abdominal pain, dysuria, but denies headache, hemoptysis, vomiting of blood, nausea, or vomiting.  She had CT of the chest that I have personally reviewed that shows a cystic abnormality.  I think likely a lung abscess.  She says that she continues to drink alcohol on a daily basis.  As mentioned, she smoked cocaine about 2 days prior to admission.  She was found to be afebrile but tachycardic, hypotensive, and she was fluid resuscitated.  Her white blood count was 26,700, hemoglobin level 5.9.  Chest CT as mentioned.  Her past medical history is positive for COPD, and apparently, she has hepatitis C.  She has a history of hypertension and substance abuse.  Surgically, she has had an abdominal hysterectomy and surgery on a right leg fracture.  SOCIAL HISTORY:  It is unclear how much  she smokes.  It is not clear how much she drinks of alcohol.  She does use illicit drugs.  She has difficulty providing any family history, but apparently there is a family history of COPD, but that is as far as she can tell me.  She is allergic to PENICILLIN based on the records.  REVIEW OF SYSTEMS:  Really not obtainable considering her poor ability to give a history.  The rest of her review of systems is as above.  PHYSICAL EXAMINATION:  CONSTITUTIONAL:  She is awake, writhing around, and somewhat distracted and confused.  It is hard to get her to focus. EYES:  Her pupils react. EARS, NOSE, MOUTH, AND THROAT:  Her mucous membranes are dry.  She has missing teeth.  Hearing is grossly normal. CARDIOVASCULAR:  Her heart is regular with tachycardia. RESPIRATORY:  She has bilateral rhonchi and some wheezing. GASTROINTESTINAL:  Her abdomen is soft, mildly tender. MUSCULOSKELETAL:  Really unable to assess. PSYCHIATRIC:  Also difficult to assess considering her inability to focus. NEUROLOGICAL:  She is able to move all 4 extremities.  Does not have any focal  abnormalities. SKIN:  Warm and dry.  ASSESSMENT:  I think this is most likely lung abscess from aspiration. I agree we should go ahead and do AFB smears.  She may need bronchoscopy later.  I would go ahead and start her on IV antibiotics as you have already done.  Continue IV fluids.  Thanks for allowing me to see her with you.     Cloud Graham L. Juanetta Gosling, M.D.   ______________________________ Oneal Deputy. Juanetta Gosling, M.D.    ELH/MEDQ  D:  Oct 02, 2016  T:  09/27/2016  Job:  [id number]

## 2016-10-01 NOTE — Progress Notes (Addendum)
Interim note--Late Entry  After arrival to the floor, the patient became confused and agitated. Haldol 5 mg IV 1 was given initially. Shortly after Haldol, the patient had seizure-like activity with tonic-clonic type activity lasting approximately 1-2 minutes.  Rapid response was called.  The patient's vital signs remained stable at that time with HR 100-110 and SBP 110s and DBP 70s. After her tonic clonic type activity, the patient would follow simple commands but remained confused and agitated impeding medical care. Ativan 1 mg IV 1 was given, and the patient was transferred to the ICU.  After the Ativan, the patient was less agitated, but remained pleasantly confused. She was able to answer 'no'when asked if she had chest pain or shortness of breath. Labs were ordered but not yet done prior to transfer to ICU. Shortly after her arrival to ICU, the patient became increasingly somnolent. I arrived at the bedside to evaluate the patient.  HR 70s with SBP 110s, 100% on 2L.  The patient was minimally responsive to noxious stimuli, but she had palpable pulses and had sinus rhythm on the monitor.. There was no gag reflex. CODE BLUE was activated.  The patient was subsequently intubated with the assistance of Dr. Margarita Grizzleanielle Ray after giving etomidate and succinylcholine.  The patient was placed on the ventilator, and Diprivan was started.  A triple-lumen catheter was placed by Dr. Margarita Grizzleanielle Ray. The patient was continued on IVF.  Labs  were ordered after rapid response for seizure like activity but had not been done prior to transfer to ICU.  Labs were re-ordered in the ICU.  In the interim, I contacted the blood bank for urgent release of PRBC.  I subsequently spoke with Dr. Jimmy PicketJohn Patrick, lab director.  He informed me that the patient had warm agglutinin antibodies, as well as other antibodies that have impeded screening and cross matching. As a result, PRBC release and transfusion will be delayed. I had multiple  conversations with Dr. Luisa HartPatrick. Dr. Luisa HartPatrick kept me updated regarding continued screening of the patient's blood.  After a number of discussions, it was felt that the risk of transfusion reaction and/or hemolysis may be greater than the benefit of emergent transfusion (without further screening her antibodies) as the patient was hemodynamically stable, and she was not likely acutely losing blood given her negative Hemoccult without other signs of acute blood loss.  Lactic acid, BMP, CBC were still pending.  ABG--pH 6.946 with undetectable bicarb.  Lab draws were still pending.  Bicarbonate 2 amps and bicarb drip were ordered.  Prior to administering bicarbonate, the patient became bradycardic and went into PEA. CODE BLUE was activated. ACLS protocol was followed. Return of spontaneous ventilation was obtained after approximately 5 minutes of ACLS protocol. Unfortunately, the patient ultimately went back into asystole and ACLS protocol was instituted. During the entire process, the patient's family was updated.  After following ACLS protocol during the second CODE BLUE, the patient did not have return of spontaneous circulation.  The patient expired at 1847.  DTat
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
## 2016-10-01 NOTE — ED Triage Notes (Signed)
Pt comes in for intermittent dizziness starting 3-4 days ago. States she has been nauseous and hasn't eaten anything. Denies any vomiting or diarrhea. Pt has had 2 falls at home, she is having pain to back and right buttocks area.

## 2016-10-01 NOTE — ED Provider Notes (Signed)
Called to icu to intubate patient.  Patient with assisted ventilations with bvm.  Sats decreasedto 87-88% and bagged up. INTUBATION Performed by: Yvonne Clay,Yvonne Clay  Required items: required blood products, implants, devices, and special equipment available Patient identity confirmed: provided demographic data and hospital-assigned identification number Time out: Immediately prior to procedure a "time out" was called to verify the correct patient, procedure, equipment, support staff and site/side marked as required.  Indications: see above  Intubation method: Glidescope Laryngoscopy   Preoxygenation: BVM  Sedatives: 10Etomidate Paralytic: 125Succinylcholine  Tube Size: 8 cuffed  Post-procedure assessment: chest rise and ETCO2 monitor Breath sounds: equal and absent over the epigastrium Tube secured with: ETT holder Chest x-Franz Svec interpreted by radiologist and me.  Chest x-Brayleigh Rybacki findings: endotracheal tube in appropriate position  Patient tolerated the procedure well with no immediate complications.  Dr. Arbutus Leasat request central line CENTRAL LINE Performed by: Nema Oatley Clay Consent: The procedure was performed in an emergent situation. Required items: required blood products, implants, devices, and special equipment available Patient identity confirmed: arm band and provided demographic data Time out: Immediately prior to procedure a "time out" was called to verify the correct patient, procedure, equipment, support staff and site/side marked as required. Indications: vascular access Anesthesia: local infiltration Local anesthetic: lidocaine 1% with epinephrine Anesthetic total: 3 ml Patient sedated: no Preparation: skin prepped with 2% chlorhexidine Skin prep agent dried: skin prep agent completely dried prior to procedure Sterile barriers: all five maximum sterile barriers used - cap, mask, sterile gown, sterile gloves, and large sterile sheet Hand hygiene: hand hygiene performed prior  to central venous catheter insertion  Location details: right ij  Catheter type: triple lumen Catheter size: 8 Fr Pre-procedure: landmarks identified Ultrasound guidance: yes Successful placement: yes Post-procedure: line sutured and dressing applied Assessment: blood return through all parts, free fluid flow, placement verified by x-Xaviar Lunn and no pneumothorax on x-Shavette Shoaff Patient tolerance: Patient tolerated the procedure well with no immediate complications.     Margarita Grizzleay, Kham Zuckerman, MD 09/27/16 [phone]

## 2016-10-01 NOTE — Progress Notes (Signed)
[facility] called.  Patient is a full release, referral [id number].  Tim [facility] coordinator.

## 2016-10-01 NOTE — Progress Notes (Signed)
Pharmacy Antibiotic Note  Yvonne Clay is a 61 y.o. female admitted on March 29, 2017 with sepsis due to PNA.  Pharmacy has been consulted for aztreonam and vancomycin dosing. Initial doses given in the ED  Plan: Cont aztreonam 2gm IV q8 hours Cont vancomycin 500 mg IV q12 hours Flagyl as per MD F/u renal function, cultures and clinical course  Height: 5\' 4"  (162.6 cm) Weight: 120 lb (54.4 kg) IBW/kg (Calculated) : 54.7  Temp (24hrs), Avg:97.6 F (36.4 C), Min:97.5 F (36.4 C), Max:97.6 F (36.4 C)   Recent Labs Lab Apr 20, 2017 0754 Apr 20, 2017 0809  WBC 26.7*  --   CREATININE 0.97  --   LATICACIDVEN  --  5.68*    Estimated Creatinine Clearance: 53.6 mL/min (by C-G formula based on SCr of 0.97 mg/dL).    Allergies  Allergen Reactions  . Penicillins      Thank you for allowing pharmacy to be a part of this patient's care.  Talbert CageSeay, Feliz Herard Poteet March 29, 2017 12:09 PM

## 2016-10-01 NOTE — Progress Notes (Signed)
Please refer to code blue flowsheet

## 2016-10-01 NOTE — ED Notes (Signed)
Patient admits to using crack a couple of days ago but none since.

## 2016-10-01 NOTE — Consult Note (Signed)
Pt seen discussed briefly with Dr. Arbutus Leasat. Agree with treatment for aspiration pneumonia/ lung abscess. Tb less likely but would do afb x3. Full note dictated.

## 2016-10-01 NOTE — ED Notes (Signed)
CRITICAL VALUE ALERT  Critical Value:  Hgb 6.1  Date & Time Notied:  2016/09/07  Provider Notified: Dr. Ronne BinningWray  Orders Received/Actions taken: no additional orders received.

## 2016-10-01 NOTE — ED Notes (Signed)
CRITICAL VALUE ALERT  Critical Value:  Lactic 5.5  Date & Time Notied:  2016/12/09  Provider Notified: Dr. Rosalia Hammersay  Orders Received/Actions taken: No new orders

## 2016-10-01 NOTE — ED Notes (Signed)
Patient fidgety, restless, co low back pain and sweating. Asking for pain medication. States pulse ox probe is bothering her.

## 2016-10-01 DEATH — deceased
